# Patient Record
Sex: Female | Born: 1965 | Hispanic: Yes | Marital: Single | Smoking: Never smoker
Health system: Southern US, Community
[De-identification: ages and names within clinical notes are randomized; demographics above are authoritative.]

## PROBLEM LIST (undated history)

## (undated) ENCOUNTER — Emergency Department (HOSPITAL_COMMUNITY): Admission: EM | Discharge status: Absent without leave | Payer: Self-pay

## (undated) DIAGNOSIS — K819 Cholecystitis, unspecified: Secondary | ICD-10-CM

## (undated) DIAGNOSIS — K810 Acute cholecystitis: Secondary | ICD-10-CM

## (undated) DIAGNOSIS — K8020 Calculus of gallbladder without cholecystitis without obstruction: Secondary | ICD-10-CM

## (undated) HISTORY — DX: Cholecystitis, unspecified: K81.9

## (undated) HISTORY — PX: NO PAST SURGERIES: SHX2092

## (undated) HISTORY — DX: Calculus of gallbladder without cholecystitis without obstruction: K80.20

## (undated) HISTORY — DX: Acute cholecystitis: K81.0

---

## 2014-03-30 ENCOUNTER — Emergency Department: Payer: Self-pay | Admitting: Emergency Medicine

## 2016-06-15 ENCOUNTER — Encounter: Payer: Self-pay | Admitting: Emergency Medicine

## 2016-06-15 ENCOUNTER — Emergency Department: Payer: Self-pay

## 2016-06-15 ENCOUNTER — Emergency Department
Admission: EM | Admit: 2016-06-15 | Discharge: 2016-06-15 | Disposition: A | Discharge status: Home / Self Care | Payer: Self-pay | Attending: Emergency Medicine | Admitting: Emergency Medicine

## 2016-06-15 DIAGNOSIS — R1013 Epigastric pain: Secondary | ICD-10-CM

## 2016-06-15 DIAGNOSIS — K8020 Calculus of gallbladder without cholecystitis without obstruction: Secondary | ICD-10-CM | POA: Insufficient documentation

## 2016-06-15 LAB — BASIC METABOLIC PANEL
ANION GAP: 7 (ref 5–15)
BUN: 14 mg/dL (ref 6–20)
CHLORIDE: 108 mmol/L (ref 101–111)
CO2: 24 mmol/L (ref 22–32)
Calcium: 9.2 mg/dL (ref 8.9–10.3)
Creatinine, Ser: 0.71 mg/dL (ref 0.44–1.00)
GFR calc Af Amer: >60 mL/min (ref >60)
Glucose, Bld: 128 mg/dL — ABNORMAL HIGH (ref 65–99)
Potassium: 3.6 mmol/L (ref 3.5–5.1)
SODIUM: 139 mmol/L (ref 135–145)

## 2016-06-15 LAB — CBC
HEMATOCRIT: 44 % (ref 35.0–47.0)
Hemoglobin: 15.3 g/dL (ref 12.0–16.0)
MCH: 31 pg (ref 26.0–34.0)
MCHC: 34.7 g/dL (ref 32.0–36.0)
MCV: 89.3 fL (ref 80.0–100.0)
Platelets: 163 10*3/uL (ref 150–440)
RBC: 4.93 MIL/uL (ref 3.80–5.20)
RDW: 12.7 % (ref 11.5–14.5)
WBC: 7.6 10*3/uL (ref 3.6–11.0)

## 2016-06-15 LAB — HEPATIC FUNCTION PANEL
ALBUMIN: 4.3 g/dL (ref 3.5–5.0)
ALK PHOS: 102 U/L (ref 38–126)
ALT: 27 U/L (ref 14–54)
AST: 57 U/L — AB (ref 15–41)
BILIRUBIN TOTAL: 1.2 mg/dL (ref 0.3–1.2)
Bilirubin, Direct: 0.2 mg/dL (ref 0.1–0.5)
Indirect Bilirubin: 1 mg/dL — ABNORMAL HIGH (ref 0.3–0.9)
TOTAL PROTEIN: 7.7 g/dL (ref 6.5–8.1)

## 2016-06-15 LAB — TROPONIN I: Troponin I: <0.03 ng/mL (ref <0.03)

## 2016-06-15 LAB — LIPASE, BLOOD: Lipase: 21 U/L (ref 11–51)

## 2016-06-15 MED ORDER — SODIUM CHLORIDE 0.9 % IV BOLUS (SEPSIS)
1000.0000 mL | Freq: Once | INTRAVENOUS | Status: AC
  Administered 2016-06-15: 1000 mL via INTRAVENOUS

## 2016-06-15 MED ORDER — ONDANSETRON HCL 4 MG PO TABS: 4.0000 mg | ORAL_TABLET | Freq: Three times a day (TID) | ORAL | 0 refills | Status: DC | PRN

## 2016-06-15 MED ORDER — ONDANSETRON HCL 4 MG/2ML IJ SOLN
INTRAMUSCULAR | Status: AC
  Administered 2016-06-15: 4 mg via INTRAVENOUS
  Filled 2016-06-15: qty 2

## 2016-06-15 MED ORDER — MORPHINE SULFATE (PF) 4 MG/ML IV SOLN
INTRAVENOUS | Status: AC
  Administered 2016-06-15: 4 mg via INTRAVENOUS
  Filled 2016-06-15: qty 1

## 2016-06-15 MED ORDER — OXYCODONE-ACETAMINOPHEN 5-325 MG PO TABS
1.0000 | ORAL_TABLET | Freq: Once | ORAL | Status: AC
  Administered 2016-06-15: 1 via ORAL

## 2016-06-15 MED ORDER — OXYCODONE-ACETAMINOPHEN 5-325 MG PO TABS: 1.0000 | ORAL_TABLET | ORAL | 0 refills | Status: DC | PRN

## 2016-06-15 MED ORDER — OXYCODONE-ACETAMINOPHEN 5-325 MG PO TABS
ORAL_TABLET | ORAL | Status: AC
  Filled 2016-06-15: qty 1

## 2016-06-15 MED ORDER — MORPHINE SULFATE (PF) 4 MG/ML IV SOLN
4.0000 mg | Freq: Once | INTRAVENOUS | Status: AC
  Administered 2016-06-15: 4 mg via INTRAVENOUS

## 2016-06-15 MED ORDER — ONDANSETRON HCL 4 MG/2ML IJ SOLN
4.0000 mg | Freq: Once | INTRAMUSCULAR | Status: AC
  Administered 2016-06-15: 4 mg via INTRAVENOUS

## 2016-06-15 NOTE — Discharge Instructions (Signed)
Please seek medical attention for any high fevers, chest pain, shortness of breath, change in behavior, persistent vomiting, bloody stool or any other new or concerning symptoms.  

## 2016-06-15 NOTE — ED Provider Notes (Signed)
[facility] Emergency Department Provider Note   ____________________________________________   I have reviewed the triage vital signs and the nursing notes.   HISTORY  Chief Complaint Epigastric pain and vomiting  History limited by: Not Limited   HPI Christine Flowers is a 51 y.o. female who presents to the emergency department today because of concerns for epigastric pain. Patient states that it started yesterday. She had been feeling tired throughout the day and then as the night progressed the pain got worse. She describes it as sharp. It does radiate to her back and her shoulders. It has been constant today. It has been accompanied by nausea and vomiting. Patient has not had any fevers. She states she had similar pain roughly 1 year ago and was told she had a pulled muscle.   History reviewed. No pertinent past medical history.  There are no active problems to display for this patient.   History reviewed. No pertinent surgical history.  Prior to Admission medications   Not on File    Allergies Patient has no known allergies.  No family history on file.  Social History Social History  Substance Use Topics  . Smoking status: Never Smoker  . Smokeless tobacco: Never Used  . Alcohol use No    Review of Systems Constitutional: No fever/chills Eyes: No visual changes. ENT: No sore throat. Cardiovascular: Positive chest pain. Respiratory: Denies shortness of breath. Gastrointestinal: Positive for epigastric pain, vomiting Genitourinary: Negative for dysuria. Musculoskeletal: Negative for back pain. Skin: Negative for rash. Neurological: Negative for headaches, focal weakness or numbness.  ____________________________________________   PHYSICAL EXAM:  VITAL SIGNS: ED Triage Vitals  Enc Vitals Group     BP 06/15/16 1443 130/81     Pulse Rate 06/15/16 1443 77     Resp --      Temp 06/15/16 1443 97.9 F (36.6 C)     Temp Source  06/15/16 1443 Oral     SpO2 06/15/16 1443 97 %     Weight 06/15/16 1446 160 lb (72.6 kg)     Height 06/15/16 1446 5\' 5"  (1.651 m)     Head Circumference --      Peak Flow --      Pain Score 06/15/16 1449 6    Constitutional: Alert and oriented. Appears uncomfortable.  Eyes: Conjunctivae are normal.  ENT   Head: Normocephalic and atraumatic.   Nose: No congestion/rhinnorhea.   Mouth/Throat: Mucous membranes are moist.   Neck: No stridor. Hematological/Lymphatic/Immunilogical: No cervical lymphadenopathy. Cardiovascular: Normal rate, regular rhythm.  No murmurs, rubs, or gallops. Respiratory: Normal respiratory effort without tachypnea nor retractions. Breath sounds are clear and equal bilaterally. No wheezes/rales/rhonchi. Gastrointestinal: Soft and tender in the epigastric region. No rebound. No guarding.  Genitourinary: Deferred Musculoskeletal: Normal range of motion in all extremities. No lower extremity edema. Neurologic:  Normal speech and language. No gross focal neurologic deficits are appreciated.  Skin:  Skin is warm, dry and intact. No rash noted. Psychiatric: Mood and affect are normal. Speech and behavior are normal. Patient exhibits appropriate insight and judgment.  ____________________________________________    LABS (pertinent positives/negatives)  Labs Reviewed  BASIC METABOLIC PANEL - Abnormal; Notable for the following:       Result Value   Glucose, Bld 128 (*)    All other components within normal limits  HEPATIC FUNCTION PANEL - Abnormal; Notable for the following:    AST 57 (*)    Indirect Bilirubin 1.0 (*)    All other  components within normal limits  CBC  TROPONIN I  LIPASE, BLOOD     ____________________________________________   EKG  I, Phineas Semen, attending physician, personally viewed and interpreted this EKG  EKG Time: 1440 Rate: 68 Rhythm: normal sinus rhythm Axis: normal Intervals: qtc 421 QRS: LVH ST changes:  no st elevation Impression: abnormal ekg   ____________________________________________    RADIOLOGY  CXR  IMPRESSION: No active cardiopulmonary disease.  [place] RUQ IMPRESSION: 1. Gallbladder contracted around numerous stones. Absent sonographic Murphy sign arguing against acute cholecystitis. 2. No evidence of biliary obstruction and normal ultrasound appearance of the liver. ____________________________________________   PROCEDURES  Procedures  ____________________________________________   INITIAL IMPRESSION / ASSESSMENT AND PLAN / ED COURSE  Pertinent labs & imaging results that were available during my care of the patient were reviewed by me and considered in my medical decision making (see chart for details).  Patient presented to the emergency department today because of concerns for epigastric pain. Workup does show cholelithiasis. Patient did feel better after pain medication. No signs of acute cholecystitis blood work did not show any concerning leukocytosis transaminitis or elevated lipase. At this point I do feel patient is safe for outpatient follow-up. Will give surgery information. Will give pain medication and nausea medication. [place] Drug Database Was Queried Prior to Prescription.  ____________________________________________   FINAL CLINICAL IMPRESSION(S) / ED DIAGNOSES  Final diagnoses:  Epigastric pain  Gallstones     Note: This dictation was prepared with Dragon dictation. Any transcriptional errors that result from this process are unintentional     Phineas Semen, MD 06/15/16 1910

## 2016-06-15 NOTE — ED Triage Notes (Signed)
Patient presents to the ED with severe intermittent centralized chest pain that radiates into patient's back and shoulders.  Patient states shoulder/back pain began yesterday but pain became severe today.  Patient states around 2pm she began vomiting and has vomited approx. 5 times since then.  Patient denies abdominal pain and diarrhea.  Patient reports pain feels like a stabbing pain.  Patient appears uncomfortable, holding her chest and tearful during triage.

## 2016-06-15 NOTE — ED Notes (Signed)
Pt daughter reports that pt is having chest and back pain since yesterday - she states the pain increased today and she has started to vomit - vomited 4 times in 24 hours - denies pain or difficulty with urination

## 2016-06-23 ENCOUNTER — Telehealth: Payer: Self-pay

## 2016-06-23 NOTE — Telephone Encounter (Signed)
Patient was seen in the ED on 06/15/16 for Cholelithiasis and Epigastric Pain. I called her to make a follow up appointment in our office. I received her voicemail and left her a message to call us back to schedule.

## 2016-07-05 ENCOUNTER — Encounter: Payer: Self-pay | Admitting: Surgery

## 2016-07-05 ENCOUNTER — Ambulatory Visit (INDEPENDENT_AMBULATORY_CARE_PROVIDER_SITE_OTHER): Payer: Self-pay | Admitting: Surgery

## 2016-07-05 ENCOUNTER — Inpatient Hospital Stay
Admission: AD | Admit: 2016-07-05 | Discharge: 2016-07-07 | DRG: 419 | Disposition: A | Discharge status: Home / Self Care | Payer: Medicaid Other | Source: Ambulatory Visit | Attending: General Surgery | Admitting: General Surgery

## 2016-07-05 DIAGNOSIS — K810 Acute cholecystitis: Secondary | ICD-10-CM | POA: Diagnosis present

## 2016-07-05 DIAGNOSIS — K819 Cholecystitis, unspecified: Secondary | ICD-10-CM | POA: Diagnosis present

## 2016-07-05 DIAGNOSIS — K8012 Calculus of gallbladder with acute and chronic cholecystitis without obstruction: Secondary | ICD-10-CM | POA: Diagnosis present

## 2016-07-05 HISTORY — DX: Cholecystitis, unspecified: K81.9

## 2016-07-05 HISTORY — DX: Acute cholecystitis: K81.0

## 2016-07-05 LAB — COMPREHENSIVE METABOLIC PANEL
ALK PHOS: 124 U/L (ref 38–126)
ALT: 67 U/L — ABNORMAL HIGH (ref 14–54)
AST: 38 U/L (ref 15–41)
Albumin: 4.1 g/dL (ref 3.5–5.0)
Anion gap: 8 (ref 5–15)
BILIRUBIN TOTAL: 0.9 mg/dL (ref 0.3–1.2)
BUN: 10 mg/dL (ref 6–20)
CALCIUM: 9.1 mg/dL (ref 8.9–10.3)
CO2: 23 mmol/L (ref 22–32)
Chloride: 105 mmol/L (ref 101–111)
Creatinine, Ser: 0.63 mg/dL (ref 0.44–1.00)
GFR calc Af Amer: >60 mL/min (ref >60)
Glucose, Bld: 180 mg/dL — ABNORMAL HIGH (ref 65–99)
POTASSIUM: 3.6 mmol/L (ref 3.5–5.1)
Sodium: 136 mmol/L (ref 135–145)
TOTAL PROTEIN: 7.2 g/dL (ref 6.5–8.1)

## 2016-07-05 LAB — CBC WITH DIFFERENTIAL/PLATELET
BASOS PCT: 0 %
Basophils Absolute: 0 10*3/uL (ref 0–0.1)
EOS ABS: 0.3 10*3/uL (ref 0–0.7)
EOS PCT: 4 %
HCT: 40.9 % (ref 35.0–47.0)
HEMOGLOBIN: 14 g/dL (ref 12.0–16.0)
Lymphocytes Relative: 34 %
Lymphs Abs: 2.4 10*3/uL (ref 1.0–3.6)
MCH: 30.9 pg (ref 26.0–34.0)
MCHC: 34.3 g/dL (ref 32.0–36.0)
MCV: 90.3 fL (ref 80.0–100.0)
MONO ABS: 0.3 10*3/uL (ref 0.2–0.9)
MONOS PCT: 4 %
NEUTROS PCT: 58 %
Neutro Abs: 4.1 10*3/uL (ref 1.4–6.5)
PLATELETS: 160 10*3/uL (ref 150–440)
RBC: 4.53 MIL/uL (ref 3.80–5.20)
RDW: 12.6 % (ref 11.5–14.5)
WBC: 7.2 10*3/uL (ref 3.6–11.0)

## 2016-07-05 LAB — SURGICAL PCR SCREEN
MRSA, PCR: NEGATIVE
STAPHYLOCOCCUS AUREUS: NEGATIVE

## 2016-07-05 LAB — PHOSPHORUS: Phosphorus: 3.3 mg/dL (ref 2.5–4.6)

## 2016-07-05 LAB — MAGNESIUM: MAGNESIUM: 1.7 mg/dL (ref 1.7–2.4)

## 2016-07-05 MED ORDER — HYDROMORPHONE HCL 2 MG/ML IJ SOLN: 0.5000 mg | INTRAMUSCULAR | Status: AC | PRN

## 2016-07-05 MED ORDER — PANTOPRAZOLE SODIUM 40 MG IV SOLR
40.0000 mg | Freq: Every day | INTRAVENOUS | Status: DC
  Administered 2016-07-05 – 2016-07-06 (×2): 40 mg via INTRAVENOUS
  Filled 2016-07-05 (×2): qty 40

## 2016-07-05 MED ORDER — PANTOPRAZOLE SODIUM 40 MG IV SOLR: 40.0000 mg | Freq: Every day | INTRAVENOUS | Status: AC

## 2016-07-05 MED ORDER — KCL IN DEXTROSE-NACL 20-5-0.45 MEQ/L-%-% IV SOLN
INTRAVENOUS | Status: DC
  Administered 2016-07-05 – 2016-07-06 (×2): via INTRAVENOUS
  Filled 2016-07-05 (×6): qty 1000

## 2016-07-05 MED ORDER — PIPERACILLIN-TAZOBACTAM 3.375 G IVPB
3.3750 g | Freq: Three times a day (TID) | INTRAVENOUS | Status: DC
  Administered 2016-07-05 – 2016-07-07 (×5): 3.375 g via INTRAVENOUS
  Filled 2016-07-05 (×8): qty 50

## 2016-07-05 MED ORDER — ONDANSETRON HCL 40 MG/20ML IJ SOLN: 4.0000 mg | Freq: Four times a day (QID) | INTRAMUSCULAR | Status: AC | PRN

## 2016-07-05 MED ORDER — HYDRALAZINE HCL 20 MG/ML IJ SOLN: 10.0000 mg | INTRAMUSCULAR | Status: DC | PRN

## 2016-07-05 MED ORDER — POLYETHYLENE GLYCOL 3350 17 G PO PACK: 17.0000 g | PACK | Freq: Every day | ORAL | Status: AC | PRN

## 2016-07-05 MED ORDER — DIPHENHYDRAMINE HCL 25 MG PO CAPS: 25.0000 mg | ORAL_CAPSULE | Freq: Four times a day (QID) | ORAL | Status: DC | PRN

## 2016-07-05 MED ORDER — KETOROLAC TROMETHAMINE 30 MG/ML IJ SOLN: 30.0000 mg | Freq: Four times a day (QID) | INTRAMUSCULAR | Status: AC

## 2016-07-05 MED ORDER — ONDANSETRON 4 MG PO TBDP: 4.0000 mg | ORAL_TABLET | Freq: Four times a day (QID) | ORAL | Status: DC | PRN

## 2016-07-05 MED ORDER — ENOXAPARIN SODIUM 150 MG/ML ~~LOC~~ SOLN: 40.0000 mg | SUBCUTANEOUS | Status: AC

## 2016-07-05 MED ORDER — LACTATED RINGERS IV SOLN: 125.0000 mL/h | INTRAVENOUS | Status: AC

## 2016-07-05 MED ORDER — MORPHINE SULFATE (PF) 4 MG/ML IV SOLN
4.0000 mg | INTRAVENOUS | Status: DC | PRN
  Administered 2016-07-05 – 2016-07-06 (×5): 4 mg via INTRAVENOUS
  Filled 2016-07-05 (×5): qty 1

## 2016-07-05 MED ORDER — DIPHENHYDRAMINE HCL 50 MG/ML IJ SOLN: 25.0000 mg | Freq: Four times a day (QID) | INTRAMUSCULAR | Status: DC | PRN

## 2016-07-05 MED ORDER — ONDANSETRON HCL 4 MG/2ML IJ SOLN
4.0000 mg | Freq: Four times a day (QID) | INTRAMUSCULAR | Status: DC | PRN
  Administered 2016-07-05 – 2016-07-06 (×4): 4 mg via INTRAVENOUS
  Filled 2016-07-05 (×4): qty 2

## 2016-07-05 MED ORDER — ENOXAPARIN SODIUM 40 MG/0.4ML ~~LOC~~ SOLN
40.0000 mg | SUBCUTANEOUS | Status: DC
  Administered 2016-07-05 – 2016-07-06 (×2): 40 mg via SUBCUTANEOUS
  Filled 2016-07-05 (×2): qty 0.4

## 2016-07-05 NOTE — H&P (Signed)
07/05/2016  Reason for Admission:  Acute cholecystitis  History of Present Illness: Christine Flowers is a 51 y.o. female who presented to the ED on 5/24 with abdominal pain and was diagnosed with cholelithiasis.  She had a normal WBC and LFTs and was discharged to home with pain and nausea medication.  She reports that the pain was better with her pain medication, but she has otherwise had daily episodes of pain and nausea, multiples times per day after eating.  Now that her pain medication has run out, she has worse pain.  She denies any emesis, but attributes this to the nausea medication that she's taking.  She reports that pain is epigastric and right upper quadrant, with radiation to the right shoulder.  Denies any fevers at home but does report chills.  Denies chest pain or shortness of breath.  Was constipated while taking the pain medication but now has normal bowel movements.  Denies dysuria or hematuria.  Denies any yellow color to her skin or sclera.  Of note, about a week ago, the patient had started taking ibuprofen for lower extremity pain and took a few days of an antispasm medication which helped and now she no longer has any lower extremity issues.  Past Medical History:     Past Medical History:  Diagnosis Date  . Gallstones      Past Surgical History:      Past Surgical History:  Procedure Laterality Date  . NO PAST SURGERIES      Home Medications:        Prior to Admission medications   Medication Sig Start Date End Date Taking? Authorizing Provider  ondansetron (ZOFRAN) 4 MG tablet Take 1 tablet (4 mg total) by mouth every 8 (eight) hours as needed for nausea or vomiting. 06/15/16   Phineas Semen, MD  oxyCODONE-acetaminophen (ROXICET) 5-325 MG tablet Take 1 tablet by mouth every 4 (four) hours as needed for severe pain. 06/15/16   Phineas Semen, MD    Allergies: No Known Allergies  Social History:  reports that she has never smoked. She  has never used smokeless tobacco. She reports that she does not drink alcohol or use drugs.   Family History:      Family History  Problem Relation Age of Onset  . Cervical cancer Mother   . Gallstones Mother   . Diabetes Mother   . Hypotension Mother   . Hypertension Father   . Diabetes Father   . Emphysema Father   . Diabetes Paternal Aunt   . Breast cancer Other   . Cervical cancer Maternal Aunt     Review of Systems: Review of Systems  Constitutional: Positive for chills. Negative for fever.  HENT: Negative for hearing loss.   Eyes: Negative for blurred vision.  Respiratory: Negative for cough and shortness of breath.   Cardiovascular: Negative for chest pain.  Gastrointestinal: Positive for abdominal pain and nausea. Negative for blood in stool, constipation, diarrhea, heartburn and vomiting.  Genitourinary: Negative for dysuria.  Musculoskeletal:       Right shoulder pain  Skin: Negative for rash.  Neurological: Negative for dizziness.  Psychiatric/Behavioral: Negative for depression.  All other systems reviewed and are negative.   Physical Exam BP 114/78   Pulse 75   Temp 98.1 F (36.7 C) (Oral)   Ht 5\' 4"  (1.626 m)   Wt 75.3 kg (166 lb)   BMI 28.49 kg/m  CONSTITUTIONAL: No acute distress, well nourished. HEENT:  Normocephalic, atraumatic, extraocular motion  intact. NECK: Trachea is midline, and there is no jugular venous distension.  RESPIRATORY:  Lungs are clear, and breath sounds are equal bilaterally. Normal respiratory effort without pathologic use of accessory muscles. CARDIOVASCULAR: Heart is regular without murmurs, gallops, or rubs. GI: The abdomen is soft, nondistended, with tenderness to palpation in the epigastric and right upper quadrant regions, with a positive Murphy's sign.  The does have some RUQ discomfort when palpating on the RLQ. There were no palpable masses.  MUSCULOSKELETAL:  Normal muscle strength and tone in all four  extremities.  No peripheral edema or cyanosis. SKIN: Skin turgor is normal. There are no pathologic skin lesions.  NEUROLOGIC:  Motor and sensation is grossly normal.  Cranial nerves are grossly intact. PSYCH:  Alert and oriented to person, place and time. Affect is normal.  Laboratory Analysis: From 06/15/16 -- WBC of 7.6, hct 15.3, T bili 1.2, AST 57, ALT 27, AP 102.  Cr 0.71.  Imaging: U/S from 06/16/26 -- 1. Gallbladder contracted around numerous stones. Absent sonographic Murphy sign arguing against acute cholecystitis.  2. No evidence of biliary obstruction and normal ultrasound appearance of the liver.  Assessment and Plan: This is a 51 y.o. female who presents with abdominal pain and what appears to be acute cholecystitis.  I have independently viewed the patient's imaging studies and reviewed the patient's laboratory studies from her ED visit.  The patient at the time had cholelithiasis without evidence of cholecystitis on ultrasound, but today does have evidence of cholecystitis on exam.    After examination and discussion with the patient in the office, I am recommending that the patient be admitted and undergo cholecystectomy during this hospital admission.  She has a positive Murphy's sign with daily episodes of pain and nausea with meals, and do not think trying to schedule her for elective surgery is indicated.  She will be admitted to the hospital and be NPO after midnight with IV fluid hydration.  She will be started on IV antibiotics and appropriate pain/nausea control.  No new imaging study is needed at this time, but will order new CBC and CMP.  She will be consented for laparoscopic cholecystectomy.  The risks of the procedure, including risk of bleeding, infection, injury to surrounding structures, and need for further procedures have been explained to the patient. I have discussed with Dr. Tonita CongWoodham, who is the surgeon on call today for admission and surgery tomorrow as an  add-on.   Howie IllJose Luis Aerik Polan, MD [facility]

## 2016-07-05 NOTE — Progress Notes (Signed)
07/05/2016  Reason for Admission:  Acute cholecystitis  History of Present Illness: Christine Flowers is a 51 y.o. female who presented to the ED on 5/24 with abdominal pain and was diagnosed with cholelithiasis.  She had a normal WBC and LFTs and was discharged to home with pain and nausea medication.  She reports that the pain was better with her pain medication, but she has otherwise had daily episodes of pain and nausea, multiples times per day after eating.  Now that her pain medication has run out, she has worse pain.  She denies any emesis, but attributes this to the nausea medication that she's taking.  She reports that pain is epigastric and right upper quadrant, with radiation to the right shoulder.  Denies any fevers at home but does report chills.  Denies chest pain or shortness of breath.  Was constipated while taking the pain medication but now has normal bowel movements.  Denies dysuria or hematuria.  Denies any yellow color to her skin or sclera.  Of note, about a week ago, the patient had started taking ibuprofen for lower extremity pain and took a few days of an antispasm medication which helped and now she no longer has any lower extremity issues.  Past Medical History: Past Medical History:  Diagnosis Date  . Gallstones      Past Surgical History: Past Surgical History:  Procedure Laterality Date  . NO PAST SURGERIES      Home Medications: Prior to Admission medications   Medication Sig Start Date End Date Taking? Authorizing Provider  ondansetron (ZOFRAN) 4 MG tablet Take 1 tablet (4 mg total) by mouth every 8 (eight) hours as needed for nausea or vomiting. 06/15/16   Phineas SemenGoodman, [place], MD  oxyCODONE-acetaminophen (ROXICET) 5-325 MG tablet Take 1 tablet by mouth every 4 (four) hours as needed for severe pain. 06/15/16   Phineas SemenGoodman, Graydon, MD    Allergies: No Known Allergies  Social History:  reports that she has never smoked. She has never used smokeless tobacco.  She reports that she does not drink alcohol or use drugs.   Family History: Family History  Problem Relation Age of Onset  . Cervical cancer Mother   . Gallstones Mother   . Diabetes Mother   . Hypotension Mother   . Hypertension Father   . Diabetes Father   . Emphysema Father   . Diabetes Paternal Aunt   . Breast cancer Other   . Cervical cancer Maternal Aunt     Review of Systems: Review of Systems  Constitutional: Positive for chills. Negative for fever.  HENT: Negative for hearing loss.   Eyes: Negative for blurred vision.  Respiratory: Negative for cough and shortness of breath.   Cardiovascular: Negative for chest pain.  Gastrointestinal: Positive for abdominal pain and nausea. Negative for blood in stool, constipation, diarrhea, heartburn and vomiting.  Genitourinary: Negative for dysuria.  Musculoskeletal:       Right shoulder pain  Skin: Negative for rash.  Neurological: Negative for dizziness.  Psychiatric/Behavioral: Negative for depression.  All other systems reviewed and are negative.   Physical Exam BP 114/78   Pulse 75   Temp 98.1 F (36.7 C) (Oral)   Ht 5\' 4"  (1.626 m)   Wt 75.3 kg (166 lb)   BMI 28.49 kg/m  CONSTITUTIONAL: No acute distress, well nourished. HEENT:  Normocephalic, atraumatic, extraocular motion intact. NECK: Trachea is midline, and there is no jugular venous distension.  RESPIRATORY:  Lungs are clear, and breath sounds  are equal bilaterally. Normal respiratory effort without pathologic use of accessory muscles. CARDIOVASCULAR: Heart is regular without murmurs, gallops, or rubs. GI: The abdomen is soft, nondistended, with tenderness to palpation in the epigastric and right upper quadrant regions, with a positive Murphy's sign.  The does have some RUQ discomfort when palpating on the RLQ. There were no palpable masses.  MUSCULOSKELETAL:  Normal muscle strength and tone in all four extremities.  No peripheral edema or cyanosis. SKIN:  Skin turgor is normal. There are no pathologic skin lesions.  NEUROLOGIC:  Motor and sensation is grossly normal.  Cranial nerves are grossly intact. PSYCH:  Alert and oriented to person, place and time. Affect is normal.  Laboratory Analysis: From 06/15/16 -- WBC of 7.6, hct 15.3, T bili 1.2, AST 57, ALT 27, AP 102.  Cr 0.71.  Imaging: U/S from 06/16/26 -- 1. Gallbladder contracted around numerous stones. Absent sonographic Murphy sign arguing against acute cholecystitis.  2. No evidence of biliary obstruction and normal ultrasound appearance of the liver.  Assessment and Plan: This is a 51 y.o. female who presents with abdominal pain and what appears to be acute cholecystitis.  I have independently viewed the patient's imaging studies and reviewed the patient's laboratory studies from her ED visit.  The patient at the time had cholelithiasis without evidence of cholecystitis on ultrasound, but today does have evidence of cholecystitis on exam.    After examination and discussion with the patient, I am recommending that the patient be admitted and undergo cholecystectomy during this hospital admission.  She has a positive Murphy's sign with daily episodes of pain and nausea with meals, and do not think trying to schedule her for elective surgery is indicated.  She will be admitted to the hospital and be NPO after midnight with IV fluid hydration.  She will be started on IV antibiotics and appropriate pain/nausea control.  No new imaging study is needed at this time, but will order new CBC and CMP.  She will be consented for laparoscopic cholecystectomy.  The risks of the procedure, including risk of bleeding, infection, injury to surrounding structures, and need for further procedures have been explained to the patient. I have discussed with Dr. Tonita Cong, who is the surgeon on call today for admission and surgery tomorrow as an add-on.   Howie Ill, MD [facility]

## 2016-07-05 NOTE — Progress Notes (Signed)
   07/05/16 1808  Clinical Encounter Type  Visited With Patient;Family;Health care provider  Visit Type Initial;Other (Comment) (Advance Directive)  Referral From Nurse   Chaplain responded to request to assist patient with obtaining an Advance Directive. Chaplain left the information with the patient to review. Patient will notify nurse when ready to complete the forms.

## 2016-07-05 NOTE — Patient Instructions (Signed)
[facility] [address], [place], [place] [address] [phone]

## 2016-07-05 NOTE — Progress Notes (Signed)
   Patient seen and examined.  Continues several quadrant pain.  Labs still pending.  Agree with Dr. Aleen CampiPiscoya. Discussed with plans for laparoscopic cholecystectomy tomorrow. All questions answered to the patient's satisfaction.  Ricarda Frameharles Cohan Stipes, MD [facility]  Day [facility] [phone](7a-7p) [phone] Night [facility] [phone](7p-7a) [phone]

## 2016-07-06 ENCOUNTER — Inpatient Hospital Stay: Payer: Medicaid Other | Admitting: Anesthesiology

## 2016-07-06 ENCOUNTER — Encounter
Admission: AD | Disposition: A | Discharge status: Home / Self Care | Payer: Self-pay | Source: Ambulatory Visit | Attending: General Surgery

## 2016-07-06 ENCOUNTER — Ambulatory Visit: Admit: 2016-07-06 | Payer: Self-pay | Admitting: General Surgery

## 2016-07-06 ENCOUNTER — Encounter: Payer: Self-pay | Admitting: *Deleted

## 2016-07-06 DIAGNOSIS — K819 Cholecystitis, unspecified: Secondary | ICD-10-CM

## 2016-07-06 DIAGNOSIS — K810 Acute cholecystitis: Secondary | ICD-10-CM

## 2016-07-06 HISTORY — PX: CHOLECYSTECTOMY: SHX55

## 2016-07-06 LAB — CBC
HCT: 38.8 % (ref 35.0–47.0)
Hemoglobin: 13.2 g/dL (ref 12.0–16.0)
MCH: 30.7 pg (ref 26.0–34.0)
MCHC: 34 g/dL (ref 32.0–36.0)
MCV: 90.1 fL (ref 80.0–100.0)
Platelets: 148 10*3/uL — ABNORMAL LOW (ref 150–440)
RBC: 4.3 MIL/uL (ref 3.80–5.20)
RDW: 12.5 % (ref 11.5–14.5)
WBC: 7.6 10*3/uL (ref 3.6–11.0)

## 2016-07-06 LAB — COMPREHENSIVE METABOLIC PANEL
ALK PHOS: 108 U/L (ref 38–126)
ALT: 54 U/L (ref 14–54)
AST: 26 U/L (ref 15–41)
Albumin: 3.6 g/dL (ref 3.5–5.0)
Anion gap: 4 — ABNORMAL LOW (ref 5–15)
BUN: 9 mg/dL (ref 6–20)
CALCIUM: 8.5 mg/dL — AB (ref 8.9–10.3)
CHLORIDE: 107 mmol/L (ref 101–111)
CO2: 26 mmol/L (ref 22–32)
Creatinine, Ser: 0.57 mg/dL (ref 0.44–1.00)
GFR calc non Af Amer: >60 mL/min (ref >60)
Glucose, Bld: 106 mg/dL — ABNORMAL HIGH (ref 65–99)
POTASSIUM: 3.9 mmol/L (ref 3.5–5.1)
SODIUM: 137 mmol/L (ref 135–145)
Total Bilirubin: 1 mg/dL (ref 0.3–1.2)
Total Protein: 6.4 g/dL — ABNORMAL LOW (ref 6.5–8.1)

## 2016-07-06 LAB — PREGNANCY, URINE: PREG TEST UR: NEGATIVE

## 2016-07-06 SURGERY — LAPAROSCOPIC CHOLECYSTECTOMY
Anesthesia: General | Wound class: Clean Contaminated

## 2016-07-06 MED ORDER — LIDOCAINE HCL (CARDIAC) 20 MG/ML IV SOLN
INTRAVENOUS | Status: DC | PRN
  Administered 2016-07-06: 100 mg via INTRAVENOUS

## 2016-07-06 MED ORDER — HYDROCODONE-ACETAMINOPHEN 5-325 MG PO TABS
1.0000 | ORAL_TABLET | ORAL | Status: DC | PRN
  Administered 2016-07-06: 2 via ORAL
  Administered 2016-07-06 (×2): 1 via ORAL
  Administered 2016-07-07 (×2): 2 via ORAL
  Filled 2016-07-06: qty 1
  Filled 2016-07-06 (×3): qty 2
  Filled 2016-07-06: qty 1

## 2016-07-06 MED ORDER — LIDOCAINE HCL (PF) 2 % IJ SOLN
INTRAMUSCULAR | Status: AC
  Filled 2016-07-06: qty 2

## 2016-07-06 MED ORDER — ROCURONIUM BROMIDE 50 MG/5ML IV SOLN
INTRAVENOUS | Status: AC
  Filled 2016-07-06: qty 1

## 2016-07-06 MED ORDER — SEVOFLURANE IN SOLN
RESPIRATORY_TRACT | Status: AC
  Filled 2016-07-06: qty 250

## 2016-07-06 MED ORDER — PROPOFOL 10 MG/ML IV BOLUS
INTRAVENOUS | Status: AC
Start: 2016-07-06 — End: ?
  Filled 2016-07-06: qty 20

## 2016-07-06 MED ORDER — FENTANYL CITRATE (PF) 100 MCG/2ML IJ SOLN
25.0000 ug | INTRAMUSCULAR | Status: DC | PRN
  Administered 2016-07-06 (×4): 25 ug via INTRAVENOUS

## 2016-07-06 MED ORDER — ONDANSETRON HCL 4 MG/2ML IJ SOLN
INTRAMUSCULAR | Status: AC
  Filled 2016-07-06: qty 2

## 2016-07-06 MED ORDER — SUGAMMADEX SODIUM 200 MG/2ML IV SOLN
INTRAVENOUS | Status: AC
  Filled 2016-07-06: qty 2

## 2016-07-06 MED ORDER — ACETAMINOPHEN 10 MG/ML IV SOLN
INTRAVENOUS | Status: AC
Start: 2016-07-06 — End: ?
  Filled 2016-07-06: qty 100

## 2016-07-06 MED ORDER — DEXAMETHASONE SODIUM PHOSPHATE 10 MG/ML IJ SOLN
INTRAMUSCULAR | Status: DC | PRN
  Administered 2016-07-06: 5 mg via INTRAVENOUS

## 2016-07-06 MED ORDER — FENTANYL CITRATE (PF) 100 MCG/2ML IJ SOLN
INTRAMUSCULAR | Status: DC | PRN
  Administered 2016-07-06 (×2): 50 ug via INTRAVENOUS
  Administered 2016-07-06: 100 ug via INTRAVENOUS

## 2016-07-06 MED ORDER — LACTATED RINGERS IV SOLN
INTRAVENOUS | Status: DC
  Administered 2016-07-06 – 2016-07-07 (×3): via INTRAVENOUS

## 2016-07-06 MED ORDER — FENTANYL CITRATE (PF) 100 MCG/2ML IJ SOLN
INTRAMUSCULAR | Status: AC
  Filled 2016-07-06: qty 2

## 2016-07-06 MED ORDER — ACETAMINOPHEN 10 MG/ML IV SOLN
INTRAVENOUS | Status: DC | PRN
  Administered 2016-07-06: 1000 mg via INTRAVENOUS

## 2016-07-06 MED ORDER — SUGAMMADEX SODIUM 200 MG/2ML IV SOLN
INTRAVENOUS | Status: DC | PRN
  Administered 2016-07-06: 150 mg via INTRAVENOUS

## 2016-07-06 MED ORDER — ROCURONIUM BROMIDE 100 MG/10ML IV SOLN
INTRAVENOUS | Status: DC | PRN
  Administered 2016-07-06: 50 mg via INTRAVENOUS

## 2016-07-06 MED ORDER — PROPOFOL 10 MG/ML IV BOLUS
INTRAVENOUS | Status: DC | PRN
  Administered 2016-07-06: 150 mg via INTRAVENOUS

## 2016-07-06 MED ORDER — BUPIVACAINE HCL 0.5 % IJ SOLN
INTRAMUSCULAR | Status: DC | PRN
  Administered 2016-07-06: 25 mL

## 2016-07-06 MED ORDER — OXYCODONE HCL 5 MG PO TABS: 5.0000 mg | ORAL_TABLET | Freq: Once | ORAL | Status: DC | PRN

## 2016-07-06 MED ORDER — MIDAZOLAM HCL 2 MG/2ML IJ SOLN
INTRAMUSCULAR | Status: AC
  Filled 2016-07-06: qty 2

## 2016-07-06 MED ORDER — OXYCODONE HCL 5 MG/5ML PO SOLN: 5.0000 mg | Freq: Once | ORAL | Status: DC | PRN

## 2016-07-06 MED ORDER — ONDANSETRON HCL 4 MG/2ML IJ SOLN
INTRAMUSCULAR | Status: DC | PRN
  Administered 2016-07-06: 4 mg via INTRAVENOUS

## 2016-07-06 MED ORDER — MIDAZOLAM HCL 2 MG/2ML IJ SOLN
INTRAMUSCULAR | Status: DC | PRN
  Administered 2016-07-06: 2 mg via INTRAVENOUS

## 2016-07-06 MED ORDER — MEPERIDINE HCL 50 MG/ML IJ SOLN: 6.2500 mg | INTRAMUSCULAR | Status: DC | PRN

## 2016-07-06 MED ORDER — PROMETHAZINE HCL 25 MG/ML IJ SOLN: 6.2500 mg | INTRAMUSCULAR | Status: DC | PRN

## 2016-07-06 SURGICAL SUPPLY — 45 items
ADHESIVE MASTISOL STRL (MISCELLANEOUS) ×3 IMPLANT
APPLIER CLIP ROT 10 11.4 M/L (STAPLE) ×3
BAG COUNTER SPONGE EZ (MISCELLANEOUS) IMPLANT
BLADE SURG SZ11 CARB STEEL (BLADE) ×3 IMPLANT
CANISTER SUCT 1200ML W/VALVE (MISCELLANEOUS) ×3 IMPLANT
CATH CHOLANG 76X19 KUMAR (CATHETERS) IMPLANT
CHLORAPREP W/TINT 26ML (MISCELLANEOUS) ×3 IMPLANT
CLIP APPLIE ROT 10 11.4 M/L (STAPLE) ×1 IMPLANT
CLOSURE WOUND 1/2 X4 (GAUZE/BANDAGES/DRESSINGS) ×1
CONRAY 60ML FOR OR (MISCELLANEOUS) IMPLANT
COUNTER SPONGE BAG EZ (MISCELLANEOUS)
DECANTER SPIKE VIAL GLASS SM (MISCELLANEOUS) ×6 IMPLANT
DRAPE SHEET LG 3/4 BI-LAMINATE (DRAPES) IMPLANT
DRSG TEGADERM 2-3/8X2-3/4 SM (GAUZE/BANDAGES/DRESSINGS) ×12 IMPLANT
DRSG TELFA 4X3 1S NADH ST (GAUZE/BANDAGES/DRESSINGS) ×3 IMPLANT
ELECT REM PT RETURN 9FT ADLT (ELECTROSURGICAL) ×3
ELECTRODE REM PT RTRN 9FT ADLT (ELECTROSURGICAL) ×1 IMPLANT
GLOVE BIO SURGEON STRL SZ7.5 (GLOVE) ×9 IMPLANT
GLOVE INDICATOR 8.0 STRL GRN (GLOVE) ×9 IMPLANT
GOWN STRL REUS W/ TWL LRG LVL3 (GOWN DISPOSABLE) ×3 IMPLANT
GOWN STRL REUS W/TWL LRG LVL3 (GOWN DISPOSABLE) ×6
GRASPER SUT TROCAR 14GX15 (MISCELLANEOUS) IMPLANT
IRRIGATION STRYKERFLOW (MISCELLANEOUS) ×1 IMPLANT
IRRIGATOR STRYKERFLOW (MISCELLANEOUS) ×3
IV NS 1000ML (IV SOLUTION) ×2
IV NS 1000ML BAXH (IV SOLUTION) ×1 IMPLANT
L-HOOK LAP DISP 36CM (ELECTROSURGICAL) ×3
LABEL OR SOLS (LABEL) ×3 IMPLANT
LHOOK LAP DISP 36CM (ELECTROSURGICAL) ×1 IMPLANT
NEEDLE HYPO 25X1 1.5 SAFETY (NEEDLE) ×3 IMPLANT
NEEDLE VERESS 14GA 120MM (NEEDLE) ×3 IMPLANT
NS IRRIG 500ML POUR BTL (IV SOLUTION) ×3 IMPLANT
PACK LAP CHOLECYSTECTOMY (MISCELLANEOUS) ×3 IMPLANT
PENCIL ELECTRO HAND CTR (MISCELLANEOUS) ×3 IMPLANT
POUCH ENDO CATCH 10MM SPEC (MISCELLANEOUS) ×3 IMPLANT
SCISSORS METZENBAUM CVD 33 (INSTRUMENTS) ×3 IMPLANT
SLEEVE ENDOPATH XCEL 5M (ENDOMECHANICALS) ×6 IMPLANT
STRIP CLOSURE SKIN 1/2X4 (GAUZE/BANDAGES/DRESSINGS) ×2 IMPLANT
SUT MNCRL 4-0 (SUTURE) ×2
SUT MNCRL 4-0 27XMFL (SUTURE) ×1
SUT VICRYL 0 AB UR-6 (SUTURE) ×3 IMPLANT
SUTURE MNCRL 4-0 27XMF (SUTURE) ×1 IMPLANT
TROCAR XCEL 12X100 BLDLESS (ENDOMECHANICALS) ×3 IMPLANT
TROCAR XCEL NON-BLD 5MMX100MML (ENDOMECHANICALS) ×3 IMPLANT
TUBING INSUFFLATOR HI FLOW (MISCELLANEOUS) ×3 IMPLANT

## 2016-07-06 NOTE — Anesthesia Preprocedure Evaluation (Signed)
Anesthesia Evaluation  Patient identified by MRN, date of birth, ID band Patient awake    Reviewed: Allergy & Precautions, NPO status , Patient's Chart, lab work & pertinent test results  History of Anesthesia Complications Negative for: history of anesthetic complications  Airway Mallampati: II  TM Distance: >3 FB Neck ROM: Full    Dental no notable dental hx.    Pulmonary neg pulmonary ROS, neg sleep apnea, neg COPD,    breath sounds clear to auscultation- rhonchi (-) wheezing      Cardiovascular Exercise Tolerance: Good (-) hypertension(-) CAD and (-) Past MI  Rhythm:Regular Rate:Normal - Systolic murmurs and - Diastolic murmurs    Neuro/Psych negative neurological ROS  negative psych ROS   GI/Hepatic negative GI ROS, Neg liver ROS,   Endo/Other  negative endocrine ROSneg diabetes  Renal/GU negative Renal ROS     Musculoskeletal negative musculoskeletal ROS (+)   Abdominal (+) - obese,   Peds  Hematology negative hematology ROS (+)   Anesthesia Other Findings   Reproductive/Obstetrics                             Anesthesia Physical Anesthesia Plan  ASA: I  Anesthesia Plan: General   Post-op Pain Management:    Induction: Intravenous  PONV Risk Score and Plan: 2 and Ondansetron, Dexamethasone, Propofol and Midazolam  Airway Management Planned: Oral ETT  Additional Equipment:   Intra-op Plan:   Post-operative Plan: Extubation in OR  Informed Consent: I have reviewed the patients History and Physical, chart, labs and discussed the procedure including the risks, benefits and alternatives for the proposed anesthesia with the patient or authorized representative who has indicated his/her understanding and acceptance.   Dental advisory given  Plan Discussed with: CRNA and Anesthesiologist  Anesthesia Plan Comments:         Anesthesia Quick Evaluation

## 2016-07-06 NOTE — Progress Notes (Signed)
Patient returned from PACU

## 2016-07-06 NOTE — Op Note (Signed)
Laparoscopic Cholecystectomy  Pre-operative Diagnosis: Acute cholecystitis  Post-operative Diagnosis: Acute on chronic cholecystitis  Procedure: Laparoscopic cholecystectomy  Surgeon: Leonette Mostharles T. Tonita CongWoodham, MD FACS  Anesthesia: Gen. with endotracheal tube  Assistant: None  Procedure Details  The patient was seen again in the Holding Room. The benefits, complications, treatment options, and expected outcomes were discussed with the patient. The risks of bleeding, infection, recurrence of symptoms, failure to resolve symptoms, bile duct damage, bile duct leak, retained common bile duct stone, bowel injury, any of which could require further surgery and/or ERCP, stent, or papillotomy were reviewed with the patient. The likelihood of improving the patient's symptoms with return to their baseline status is good.  The patient and/or family concurred with the proposed plan, giving informed consent.  The patient was taken to Operating Room, identified as Christine Flowers and the procedure verified as Laparoscopic Cholecystectomy.  A Time Out was held and the above information confirmed.  Prior to the induction of general anesthesia, antibiotic prophylaxis was administered. VTE prophylaxis was in place. General endotracheal anesthesia was then administered and tolerated well. After the induction, the abdomen was prepped with Chloraprep and draped in the sterile fashion. The patient was positioned in the supine position.  Local anesthetic  was injected into the skin near the umbilicus and an incision made. The Veress needle was placed. Pneumoperitoneum was then created with CO2 and tolerated well without any adverse changes in the patient's vital signs. A 5mm port was placed in the periumbilical position and the abdominal cavity was explored.  Two 5-mm ports were placed in the right upper quadrant and a 12 mm epigastric port was placed all under direct vision. All skin incisions  were infiltrated with a  local anesthetic agent before making the incision and placing the trocars.   The patient was positioned  in reverse Trendelenburg, tilted slightly to the patient's left.  The gallbladder was identified, the fundus grasped and retracted cephalad. Adhesions were lysed bluntly and with electrocautery. The infundibulum was grasped and retracted laterally, exposing the peritoneum overlying the triangle of Calot. This was then divided and exposed in a blunt fashion. A critical view of the cystic duct and cystic artery was obtained.  The cystic duct was clearly identified and bluntly dissected.   The rind around the gallbladder is noted to be thick and the gallbladder was retracted into the liver. The majority of the dissection of the lateral rind was done with electrocautery. A small anterior cystic artery was identified and serially clipped and cut in between with Endo Shears. The duct was then serially clipped and cut with shears after the of safety. A posterior artery was also then identified going into the posterior wall the gallbladder that was also serially clipped and cut.  The gallbladder was taken from the gallbladder fossa in a retrograde fashion with the electrocautery. The gallbladder was removed and placed in an Endocatch bag. The liver bed was irrigated and inspected. Hemostasis was achieved with the electrocautery. Copious irrigation was utilized and was repeatedly aspirated until clear.  The gallbladder and Endocatch sac were then removed through the epigastric port site.   Due to the contracted nature of the gallbladder and the numerous stones within the epigastric port site had to be widened sharply and bluntly to allow for the gallbladder to be retrieved.  Inspection of the right upper quadrant was performed. No bleeding, bile duct injury or leak, or bowel injury was noted. Pneumoperitoneum was released.  The epigastric port site was  closed with figure-of-eight 0 Vicryl sutures. 4-0  subcuticular Monocryl was used to close the skin. Steristrips and Mastisol and sterile dressings were  applied.  The patient was then extubated and brought to the recovery room in stable condition. Sponge, lap, and needle counts were correct at closure and at the conclusion of the case.   Findings: Acute on chronic Cholecystitis   Estimated Blood Loss: 30 mL         Drains: None         Specimens: Gallbladder           Complications: none               Christine Flowers T. Tonita Cong, MD, FACS

## 2016-07-06 NOTE — Progress Notes (Signed)
Patient transported to OR.

## 2016-07-06 NOTE — Anesthesia Procedure Notes (Signed)
Procedure Name: Intubation Date/Time: 07/06/2016 12:55 PM Performed by: Doreen Salvage Pre-anesthesia Checklist: Patient identified, Emergency Drugs available, Suction available and Patient being monitored Patient Re-evaluated:Patient Re-evaluated prior to inductionOxygen Delivery Method: Circle system utilized Preoxygenation: Pre-oxygenation with 100% oxygen Intubation Type: IV induction Ventilation: Mask ventilation without difficulty Laryngoscope Size: Mac and 3 Grade View: Grade I Tube type: Oral Tube size: 7.0 mm Number of attempts: 1 Airway Equipment and Method: Stylet Placement Confirmation: ETT inserted through vocal cords under direct vision,  positive ETCO2 and breath sounds checked- equal and bilateral Secured at: 22 cm Tube secured with: Tape Dental Injury: Teeth and Oropharynx as per pre-operative assessment

## 2016-07-06 NOTE — Transfer of Care (Signed)
Immediate Anesthesia Transfer of Care Note  Patient: Christine Flowers  Procedure(s) Performed: Procedure(s): LAPAROSCOPIC CHOLECYSTECTOMY (N/A)  Patient Location: PACU  Anesthesia Type:General  Level of Consciousness: awake  Airway & Oxygen Therapy: Patient connected to face mask oxygen  Post-op Assessment: Post -op Vital signs reviewed and stable  Post vital signs: stable  Last Vitals:  Vitals:   07/06/16 1138 07/06/16 1411  BP: 116/77 132/82  Pulse: (!) 58 83  Resp: 14 (!) 6  Temp: 36.6 C 37.1 C    Last Pain:  Vitals:   07/06/16 1411  TempSrc: Temporal  PainSc:          Complications: No apparent anesthesia complications

## 2016-07-06 NOTE — Brief Op Note (Signed)
07/05/2016 - 07/06/2016  1:53 PM  PATIENT:  Christine Flowers  51 y.o. female  PRE-OPERATIVE DIAGNOSIS:  acute cholecystitis  POST-OPERATIVE DIAGNOSIS:  Chronic cholecystitis  PROCEDURE:  Procedure(s): LAPAROSCOPIC CHOLECYSTECTOMY (N/A)  SURGEON:  Surgeon(s) and Role:    * Ricarda FrameWoodham, Doshia Dalia, MD - Primary  PHYSICIAN ASSISTANT: None  ASSISTANTS: none   ANESTHESIA:   general  EBL:  Total I/O In: 267 [I.V.:232; IV Piggyback:35] Out: 30 [Blood:30]  BLOOD ADMINISTERED:none  DRAINS: none   LOCAL MEDICATIONS USED:  MARCAINE   , XYLOCAINE  and Amount: 20 ml  SPECIMEN:  Source of Specimen:  Gallbladder  DISPOSITION OF SPECIMEN:  PATHOLOGY  COUNTS:  YES  TOURNIQUET:  * No tourniquets in log *  DICTATION: .Dragon Dictation  PLAN OF CARE: Returned inpatient  PATIENT DISPOSITION:  PACU - hemodynamically stable.   Delay start of Pharmacological VTE agent (>24hrs) due to surgical blood loss or risk of bleeding: no

## 2016-07-06 NOTE — Progress Notes (Signed)
CC: Cholecystitis Subjective: Patient reports that her pain has almost completely resolved. She is ready be done with surgery.  Objective: Vital signs in last 24 hours: Temp:  [97.6 F (36.4 C)-98.1 F (36.7 C)] 97.6 F (36.4 C) (06/14 0417) Pulse Rate:  [55-75] 55 (06/14 0417) Resp:  [17-20] 17 (06/14 0417) BP: (105-121)/(64-78) 105/64 (06/14 0417) SpO2:  [97 %-99 %] 99 % (06/14 0417) Weight:  [73.6 kg (162 lb 4.8 oz)-75.3 kg (166 lb)] 73.6 kg (162 lb 4.8 oz) (06/13 1722) Last BM Date: 07/05/16  Intake/Output from previous day: 06/13 0701 - 06/14 0700 In: 1201 [P.O.:120; I.V.:1041; IV Piggyback:40] Out: 1350 [Urine:1350] Intake/Output this shift: Total I/O In: 267 [I.V.:232; IV Piggyback:35] Out: -   Physical exam:  Gen.: No acute distress Chest: Clear to sedation Heart: Regular rhythm Abdomen: Soft, nontender, nondistended  Lab Results: CBC   Recent Labs  07/05/16 1906 07/06/16 0502  WBC 7.2 7.6  HGB 14.0 13.2  HCT 40.9 38.8  PLT 160 148*   BMET  Recent Labs  07/05/16 1906 07/06/16 0502  NA 136 137  K 3.6 3.9  CL 105 107  CO2 23 26  GLUCOSE 180* 106*  BUN 10 9  CREATININE 0.63 0.57  CALCIUM 9.1 8.5*   PT/INR No results for input(s): LABPROT, INR in the last 72 hours. ABG No results for input(s): PHART, HCO3 in the last 72 hours.  Invalid input(s): PCO2, PO2  Studies/Results: No results found.  Anti-infectives: Anti-infectives    Start     Dose/Rate Route Frequency Ordered Stop   07/05/16 2200  piperacillin-tazobactam (ZOSYN) IVPB 3.375 g     3.375 g 12.5 mL/hr over 240 Minutes Intravenous Every 8 hours 07/05/16 1724        Assessment/Plan:  51 year old female admitted with chronic cholecystitis from clinic yesterday. Clinically resolved but she still desires to undergo surgery. I discussed the procedure in detail. We discussed the risks and benefits of a laparoscopic cholecystectomy and possible cholangiogram including, but not  limited to bleeding, infection, injury to surrounding structures such as the intestine or liver, bile leak, retained gallstones, need to convert to an open procedure, prolonged diarrhea, blood clots such as  DVT, common bile duct injury, anesthesia risks, and possible need for additional procedures.  The likelihood of improvement in symptoms and return to the patient's normal status is good. We discussed the typical post-operative recovery course. Plan for surgery later today.   Nupur Hohman T. Tonita CongWoodham, MD, [facility] General Surgeon [facility]  Day [facility] [phone](7a-7p) [phone] Night [facility] [phone](7p-7a) [phone] 07/06/2016

## 2016-07-06 NOTE — Anesthesia Post-op Follow-up Note (Cosign Needed)
Anesthesia QCDR form completed.        

## 2016-07-06 NOTE — Anesthesia Postprocedure Evaluation (Signed)
Anesthesia Post Note  Patient: Christine Flowers  Procedure(s) Performed: Procedure(s) (LRB): LAPAROSCOPIC CHOLECYSTECTOMY (N/A)  Patient location during evaluation: PACU Anesthesia Type: General Level of consciousness: awake and alert and oriented Pain management: pain level controlled Vital Signs Assessment: post-procedure vital signs reviewed and stable Respiratory status: spontaneous breathing, nonlabored ventilation and respiratory function stable Cardiovascular status: blood pressure returned to baseline and stable Postop Assessment: no signs of nausea or vomiting Anesthetic complications: no     Last Vitals:  Vitals:   07/06/16 1426 07/06/16 1445  BP: 133/86   Pulse: 81 73  Resp: 17 16  Temp:      Last Pain:  Vitals:   07/06/16 1445  TempSrc:   PainSc: 10-Worst pain ever                 Elyse Prevo

## 2016-07-07 ENCOUNTER — Encounter: Payer: Self-pay | Admitting: General Surgery

## 2016-07-07 LAB — SURGICAL PATHOLOGY

## 2016-07-07 LAB — HIV ANTIBODY (ROUTINE TESTING W REFLEX): HIV Screen 4th Generation wRfx: NONREACTIVE

## 2016-07-07 MED ORDER — HYDROCODONE-ACETAMINOPHEN 5-325 MG PO TABS: 1.0000 | ORAL_TABLET | ORAL | 0 refills | Status: AC | PRN

## 2016-07-07 NOTE — Care Management (Signed)
Patient to discharge today. Only new medication at discharge pain medication.  No RNCM needs identified

## 2016-07-07 NOTE — Progress Notes (Signed)
Patient complains of abdominal pain. PRN hydrocodone given per PRN order. Pt rates pain leve 7. Throbbing, tight-like pain. Will continue to monitor to end of shift.

## 2016-07-07 NOTE — Discharge Summary (Signed)
Patient ID: Christine Flowers MRN: [id number] DOB/AGE: 07/23/1965 51 y.o.  Admit date: 07/05/2016 Discharge date: 07/07/2016  Discharge Diagnoses:  Cholecystitis  Procedures Performed: Laparoscopic cholecystectomy  Discharged Condition: good  Hospital Course: Patient was admitted from the clinic setting with a diagnosis of acute cholecystitis. Underwent a laparoscopic cholecystectomy and tolerated the procedure well. Was able be discharged home the first postoperative day. On the day of discharge she was tolerating a diet and her abdomen was soft, nondistended, minimally tender to palpation at her incision sites.  Discharge Orders: Discharge Instructions    Call MD for:  persistant nausea and vomiting    Complete by:  As directed    Call MD for:  redness, tenderness, or signs of infection (pain, swelling, redness, odor or green/yellow discharge around incision site)    Complete by:  As directed    Call MD for:  severe uncontrolled pain    Complete by:  As directed    Call MD for:  temperature >100.4    Complete by:  As directed    Diet - low sodium heart healthy    Complete by:  As directed    Increase activity slowly    Complete by:  As directed       Disposition: 01-Home or Self Care  Discharge Medications: Allergies as of 07/07/2016   No Known Allergies     Medication List    STOP taking these medications   ondansetron 4 MG tablet Commonly known as:  ZOFRAN   oxyCODONE-acetaminophen 5-325 MG tablet Commonly known as:  ROXICET     TAKE these medications   HYDROcodone-acetaminophen 5-325 MG tablet Commonly known as:  NORCO/VICODIN Take 1-2 tablets by mouth every 4 (four) hours as needed for moderate pain or severe pain.   ibuprofen 200 MG tablet Commonly known as:  ADVIL,MOTRIN Take 200 mg by mouth every 6 (six) hours as needed.        Follwup: Follow-up Information    Jonette Pesaeyes, Lucia, MD Follow up on 07/18/2016.   Specialty:  Family Medicine Why:   11am.  PCP follow up Contact information: [facility] [address] [place] [address] [phone]        Ricarda FrameWoodham, Seana Underwood, MD. Go in 1 week(s).   Specialty:  General Surgery Why:  postop Contact information: [address] [place] [address] [phone]           Signed: Ricarda FrameCharles Gloris Shiroma 07/07/2016, 1:52 PM

## 2016-07-07 NOTE — Care Management (Signed)
Patient post Laparoscopic cholecystectomy.  [facility] [place].  Last seen May 31th.  Patient has follow up appointment scheduled Jun 26th.  Appointment added to AVS. Patient uses their pharmacy to obtain medication.  RNCM following should needs arise.

## 2016-07-07 NOTE — Discharge Instructions (Signed)
Laparoscopic Cholecystectomy, Care After °This sheet gives you information about how to care for yourself after your procedure. Your health care provider may also give you more specific instructions. If you have problems or questions, contact your health care provider. °What can I expect after the procedure? °After the procedure, it is common to have: °· Pain at your incision sites. You will be given medicines to control this pain. °· Mild nausea or vomiting. °· Bloating and possible shoulder pain from the air-like gas that was used during the procedure. °Follow these instructions at home: °Incision care  ° °· Follow instructions from your health care provider about how to take care of your incisions. Make sure you: °¨ Wash your hands with soap and water before you change your bandage (dressing). If soap and water are not available, use hand sanitizer. °¨ Change your dressing as told by your health care provider. °¨ Leave stitches (sutures), skin glue, or adhesive strips in place. These skin closures may need to be in place for 2 weeks or longer. If adhesive strip edges start to loosen and curl up, you may trim the loose edges. Do not remove adhesive strips completely unless your health care provider tells you to do that. °· Do not take baths, swim, or use a hot tub until your health care provider approves. Ask your health care provider if you can take showers. You may only be allowed to take sponge baths for bathing. °· Check your incision area every day for signs of infection. Check for: °¨ More redness, swelling, or pain. °¨ More fluid or blood. °¨ Warmth. °¨ Pus or a bad smell. °Activity  °· Do not drive or use heavy machinery while taking prescription pain medicine. °· Do not lift anything that is heavier than 10 lb (4.5 kg) until your health care provider approves. °· Do not play contact sports until your health care provider approves. °· Do not drive for 24 hours if you were given a medicine to help you relax  (sedative). °· Rest as needed. Do not return to work or school until your health care provider approves. °General instructions  °· Take over-the-counter and prescription medicines only as told by your health care provider. °· To prevent or treat constipation while you are taking prescription pain medicine, your health care provider may recommend that you: °¨ Drink enough fluid to keep your urine clear or pale yellow. °¨ Take over-the-counter or prescription medicines. °¨ Eat foods that are high in fiber, such as fresh fruits and vegetables, whole grains, and beans. °¨ Limit foods that are high in fat and processed sugars, such as fried and sweet foods. °Contact a health care provider if: °· You develop a rash. °· You have more redness, swelling, or pain around your incisions. °· You have more fluid or blood coming from your incisions. °· Your incisions feel warm to the touch. °· You have pus or a bad smell coming from your incisions. °· You have a fever. °· One or more of your incisions breaks open. °Get help right away if: °· You have trouble breathing. °· You have chest pain. °· You have increasing pain in your shoulders. °· You faint or feel dizzy when you stand. °· You have severe pain in your abdomen. °· You have nausea or vomiting that lasts for more than one day. °· You have leg pain. °This information is not intended to replace advice given to you by your health care provider. Make sure you discuss any   questions you have with your health care provider. °Document Released: 01/09/2005 Document Revised: 07/31/2015 Document Reviewed: 06/28/2015 °Elsevier Interactive Patient Education © 2017 [employer] ° °

## 2016-07-10 ENCOUNTER — Encounter: Payer: Self-pay | Admitting: General Surgery

## 2016-07-10 ENCOUNTER — Telehealth: Payer: Self-pay

## 2016-07-10 NOTE — Telephone Encounter (Signed)
Called patient to ask her how she was feeling after surgery.  1. Patient stated that she has been feeling good after her surgery. No more abdominal pain.  2. Patient stated that her pain is under control by taking the pain medication that was prescribed. She takes 1 tablet of Hydrocodone-Acetaminophen 5-325 MG in the AM and one in the PM. This tends to take care of the pain.  3. Taking her Pain medication.  4. Patient stated that she had not had any vomiting but had been nauseas.  I recommended for her to eat saltines and drink some Ginger Ale. If this did not help her, I told her to give me a call. Patient understood.  5. Patient stated that she was not having any fever or chills.  6. Patient stated that she had been constipated for the first two days after surgery but now she is able to have a good bowel movement.   7. Patient stated that she has not had any swelling or bruising at this time.  8. Patient stated that she did not have any questions or concerns at this time. However, I told patient to give me a call in case she had further questions. Patient understood.  Patient was reminded of her appointment for 07/13/2016 with Dr. Tonita CongWoodham. Patient understood.

## 2016-07-10 NOTE — Telephone Encounter (Signed)
Had Kandis CockingMaritza to call patient due to language barrier.    Post-op call made to patient at this time. Spoke with . Post-op interview questions below.  1. How are you feeling?   2. Is your pain controlled?  3. What are you doing for the pain?   4. Are you having any Nausea or Vomiting?   5. Are you having any Fever or Chills?   6. Are you having any Constipation or Diarrhea?   7. Is there any Swelling or Bruising you are concerned about?  8. Do you have any questions or concerns at this time?    Discussion:

## 2016-07-13 ENCOUNTER — Ambulatory Visit (INDEPENDENT_AMBULATORY_CARE_PROVIDER_SITE_OTHER): Payer: Self-pay | Admitting: General Surgery

## 2016-07-13 ENCOUNTER — Encounter: Payer: Self-pay | Admitting: General Surgery

## 2016-07-13 VITALS — BP 120/82 | HR 76 | Temp 97.5°F | Ht 64.0 in | Wt 165.8 lb

## 2016-07-13 DIAGNOSIS — Z4889 Encounter for other specified surgical aftercare: Secondary | ICD-10-CM

## 2016-07-13 NOTE — Progress Notes (Signed)
Outpatient Surgical Follow Up  07/13/2016  Christine BeaversJuana Cambero Mcarthur RossettiZuniga is an 51 y.o. female.   Chief Complaint  Patient presents with  . Routine Post Op    Laparoscopic Cholecystectomy-07/06/16-Dr.Spurgeon Gancarz    HPI: 51 year old female returns to clinic 1 week status post laparoscopic cholecystectomy. Patient reports doing well. She had 1 episode of nausea since discharge that was secondary to taking her pain medicine. Otherwise she states she is eating well and denies any fevers, chills, chest pain, shortness of breath, diarrhea, constipation. She's been very happy with her surgical treatment.  Past Medical History:  Diagnosis Date  . Acute cholecystitis 07/05/2016  . Cholecystitis 07/05/2016  . Gallstones     Past Surgical History:  Procedure Laterality Date  . CHOLECYSTECTOMY N/A 07/06/2016   Procedure: LAPAROSCOPIC CHOLECYSTECTOMY;  Surgeon: Christine FrameWoodham, Shawne Bulow, MD;  Location: [facility] ORS;  Service: General;  Laterality: N/A;  . NO PAST SURGERIES      Family History  Problem Relation Age of Onset  . Cervical cancer Mother   . Gallstones Mother   . Diabetes Mother   . Hypotension Mother   . Hypertension Father   . Diabetes Father   . Emphysema Father   . Diabetes Paternal Aunt   . Breast cancer Other   . Cervical cancer Maternal Aunt     Social History:  reports that she has never smoked. She has never used smokeless tobacco. She reports that she does not drink alcohol or use drugs.  Allergies: No Known Allergies  Medications reviewed.    ROS A multipoint review of systems was completed, all pertinent positives and negatives are documented within the history of present illness the remainder are negative   BP 120/82   Pulse 76   Temp 97.5 F (36.4 C) (Oral)   Ht 5\' 4"  (1.626 m)   Wt 75.2 kg (165 lb 12.8 oz)   BMI 28.46 kg/m   Physical Exam Gen.: No acute distress Chest: Clear to auscultation Heart: Regular rhythm Abdomen: Soft, nontender, nondistended. Well  approximately lap scopic incisions with Steri-Strips still in place. Resolving ecchymosis to the upper midline incision site no evidence of erythema or drainage.    No results found for this or any previous visit (from the past 48 hour(s)). No results found.  Assessment/Plan:  1. Aftercare following surgery 51 year old female status post laparoscopic cholecystectomy. Pathology reviewed with patient. Discussed anticipated return to normal activities and standard postoperative precautions. Patient and her daughter voiced understanding and will follow-up in clinic on an as-needed basis.     Christine Frameharles Cris Gibby, MD FACS General Surgeon  07/13/2016,3:06 PM

## 2016-07-13 NOTE — Patient Instructions (Signed)
  Please call our office with any questions or concerns.  Please do not submerge in a tub, hot tub, or pool until incisions are completely sealed.  Use sun block to incision area over the next year if this area will be exposed to sun. This helps decrease scarring.  You may resume your normal activities on 08/17/16.  At that time- Listen to your body when lifting, if you have pain when lifting, stop and then try again in a few days. Pain after doing exercises or activities of daily living is normal as you get back in to your normal routine.  If you develop redness, drainage, or pain at incision sites- call our office immediately and speak with a nurse.

## 2016-08-28 ENCOUNTER — Ambulatory Visit: Payer: Self-pay

## 2016-09-20 ENCOUNTER — Ambulatory Visit
Admission: RE | Admit: 2016-09-20 | Discharge: 2016-09-20 | Disposition: A | Discharge status: Home / Self Care | Payer: Self-pay | Source: Ambulatory Visit | Attending: Oncology | Admitting: Oncology

## 2016-09-20 ENCOUNTER — Ambulatory Visit: Payer: Self-pay | Attending: Oncology | Admitting: *Deleted

## 2016-09-20 ENCOUNTER — Encounter: Payer: Self-pay | Admitting: *Deleted

## 2016-09-20 VITALS — BP 121/78 | HR 72 | Temp 96.0°F | Ht 66.0 in | Wt 164.0 lb

## 2016-09-20 DIAGNOSIS — Z0000 Encounter for general adult medical examination without abnormal findings: Secondary | ICD-10-CM

## 2016-09-20 NOTE — Progress Notes (Signed)
Subjective:     Patient ID: Christine Flowers, female   DOB: 09/10/1965, 51 y.o.   MRN: [id number]  HPI   Review of Systems     Objective:   Physical Exam  Pulmonary/Chest: Right breast exhibits no inverted nipple, no mass, no nipple discharge, no skin change and no tenderness. Left breast exhibits no inverted nipple, no mass, no nipple discharge, no skin change and no tenderness. Breasts are symmetrical.         Assessment:     51 [facility] Hispanic [facility]  Lloyda, the interpreter present during the interview and exam.  Clinical breast exam with scattered thick mobile glandular like tissue bilateral.  No dominant mass, skin changes, lymphadenopathy, or nipple discharge.  Taught self breast awareness.  Last pap was on 07/18/16 was negative with no HPV co-testing.  Next pap due in 2021. Patient has been screened for eligibility.  She does not have any insurance, Medicare or Medicaid.  She also meets financial eligibility.  Hand-out given on the Affordable Care Act.    Plan:     Screening mammogram ordered.  Will follow-up per [facility] protocol.

## 2016-09-20 NOTE — Patient Instructions (Signed)
Gave patient hand-out, Women Staying Healthy, Active and Well from [facility], with education on breast health, pap smears, heart and colon health. 

## 2016-09-27 ENCOUNTER — Other Ambulatory Visit: Payer: Self-pay | Admitting: *Deleted

## 2016-09-27 ENCOUNTER — Inpatient Hospital Stay
Admission: RE | Admit: 2016-09-27 | Discharge: 2016-09-27 | Disposition: A | Discharge status: Home / Self Care | Payer: Self-pay | Source: Ambulatory Visit | Attending: *Deleted | Admitting: *Deleted

## 2016-09-27 DIAGNOSIS — Z9289 Personal history of other medical treatment: Secondary | ICD-10-CM

## 2016-09-29 ENCOUNTER — Encounter: Payer: Self-pay | Admitting: *Deleted

## 2016-09-29 NOTE — Progress Notes (Signed)
Letter mailed from the [facility] to inform patient of her normal mammogram results.  Patient is to follow-up with annual screening in one year.  HSIS to Christy. 

## 2019-03-19 IMAGING — CR DG CHEST 2V
2 series · 2 of 2 positions shown · non-contrast
Comparison: 03/30/2014

CLINICAL DATA: Chest pain back pain

EXAM:
CHEST  2 VIEW

[chest pa]
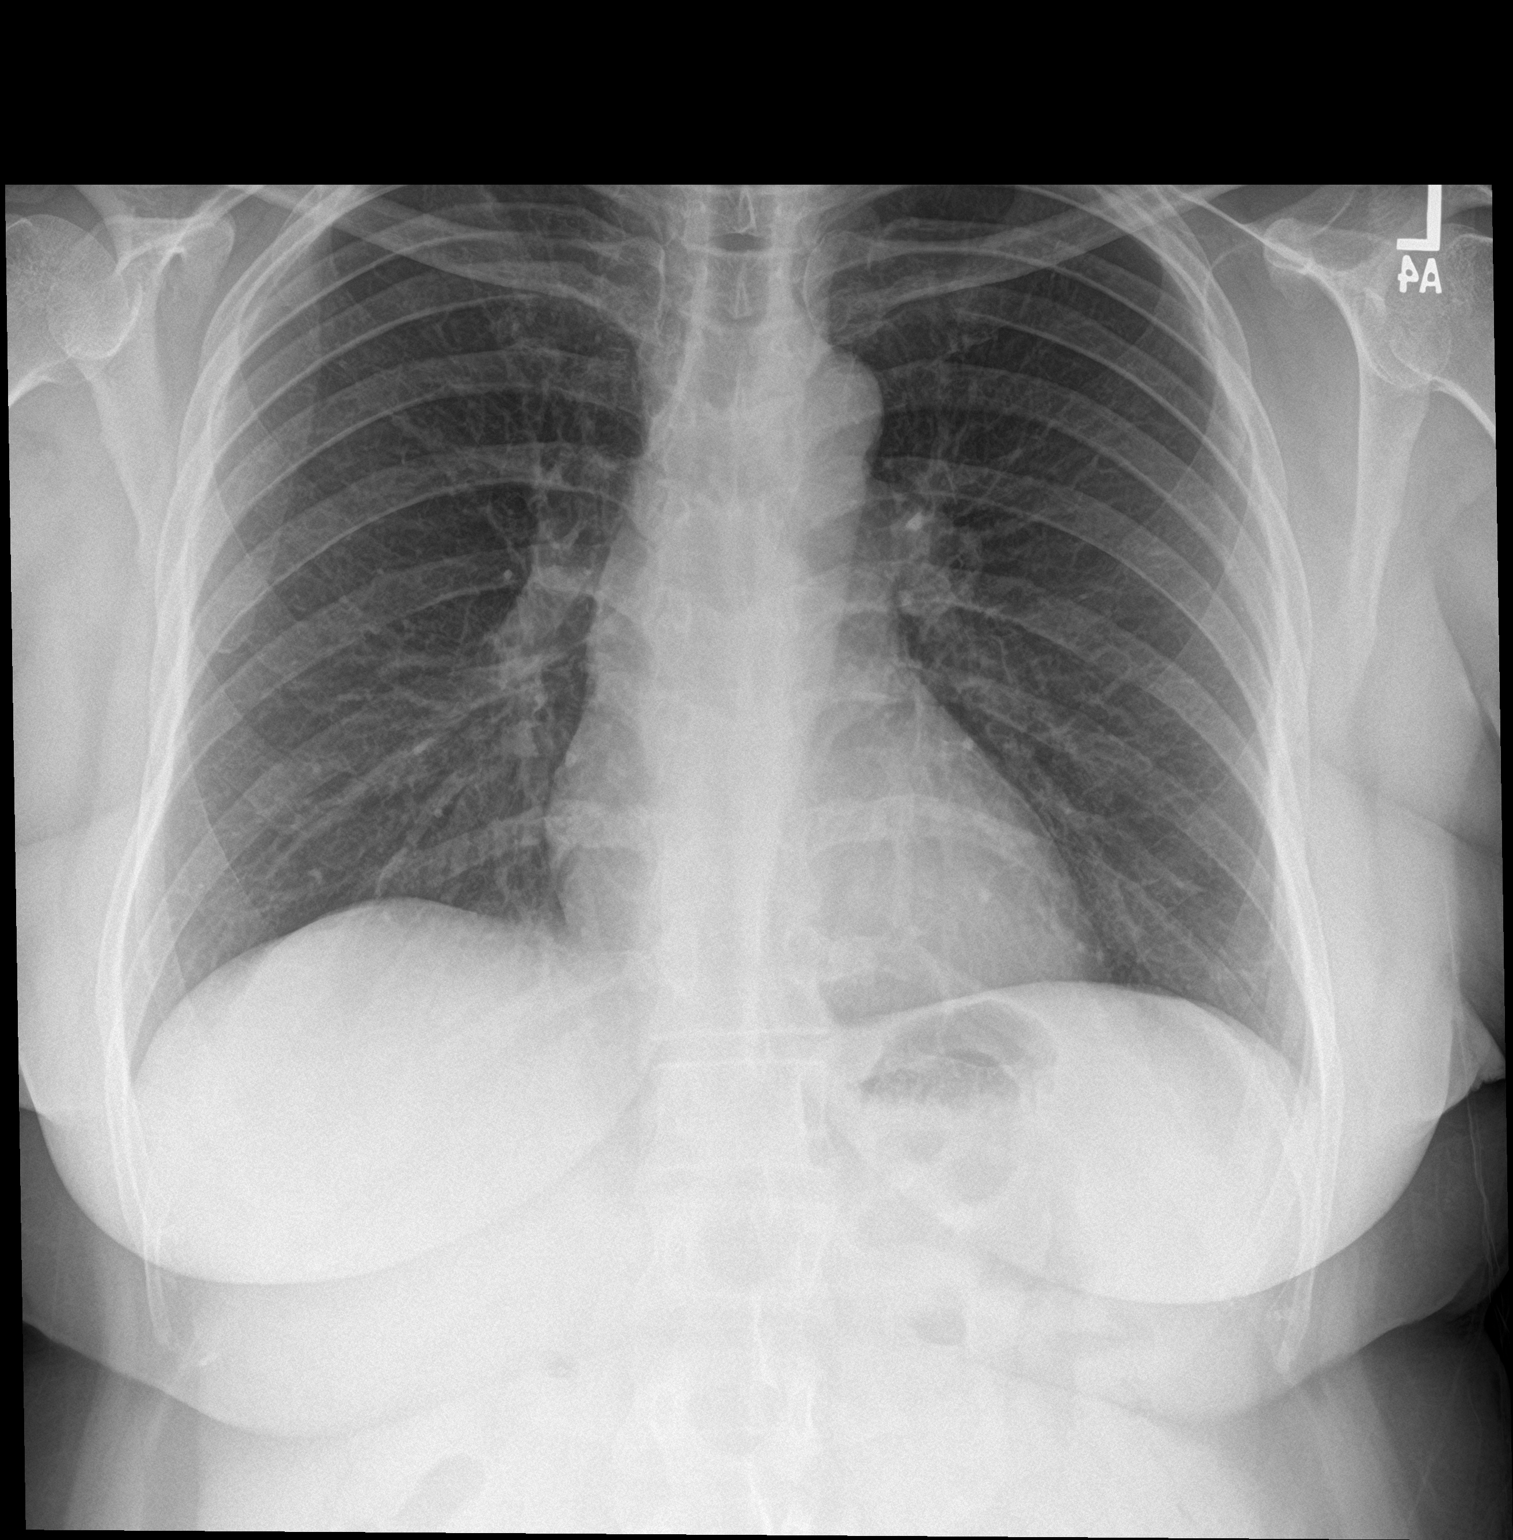

[chest lat]
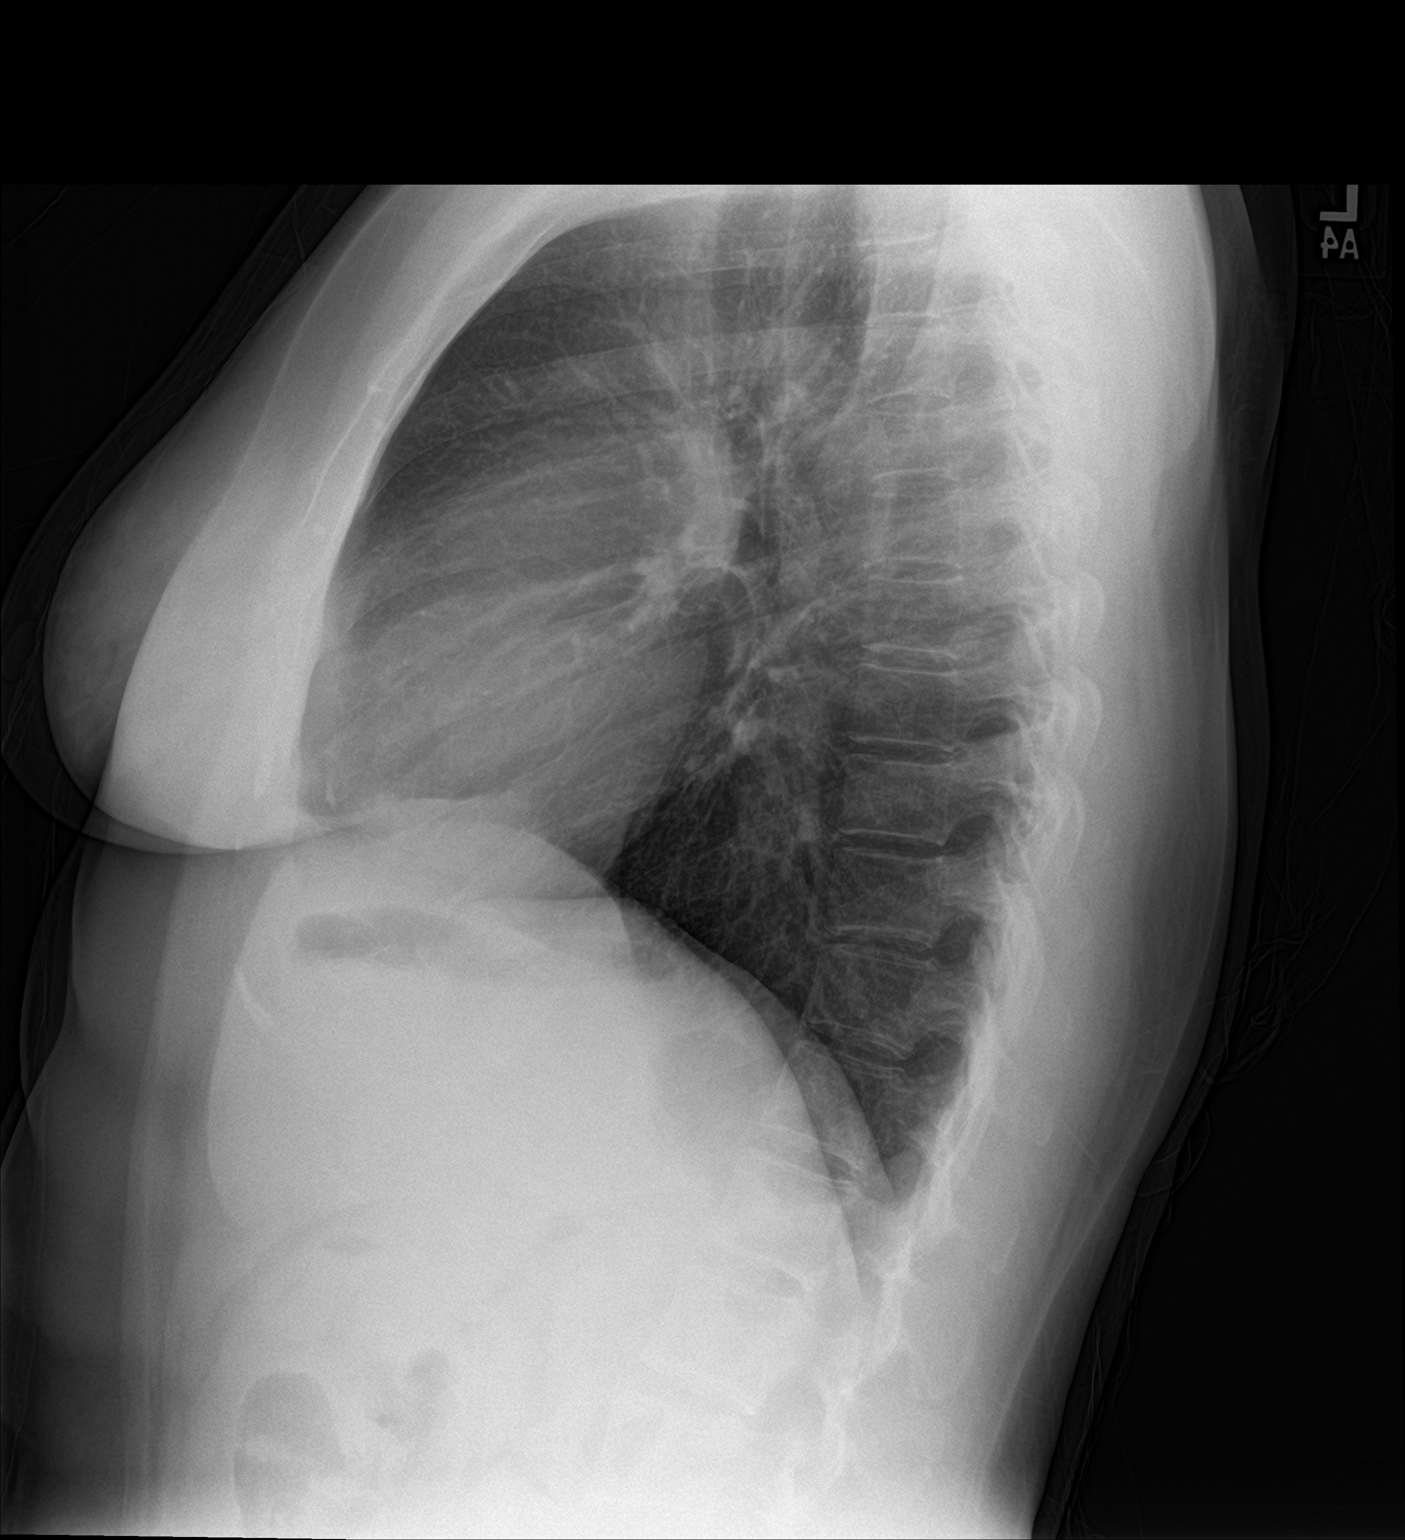

[2 of 2 positions shown; findings below may reference images not displayed]

FINDINGS: The heart size and mediastinal contours are within normal limits.
Both lungs are clear. The visualized skeletal structures are
unremarkable.
IMPRESSION: No active cardiopulmonary disease.

## 2019-08-02 ENCOUNTER — Ambulatory Visit: Payer: MEDICAID | Attending: Internal Medicine

## 2019-08-02 DIAGNOSIS — Z23 Encounter for immunization: Secondary | ICD-10-CM

## 2019-08-02 NOTE — Progress Notes (Signed)
   Covid-19 Vaccination Clinic  Name:  Christine Flowers    MRN: [id number] DOB: 09/28/1965  08/02/2019  Ms. Christine Flowers was observed post Covid-19 immunization for 15 minutes without incident. She was provided with Vaccine Information Sheet and instruction to access the V-Safe system.   Ms. Christine Flowers was instructed to call 911 with any severe reactions post vaccine: Marland Kitchen Difficulty breathing  . Swelling of face and throat  . A fast heartbeat  . A bad rash all over body  . Dizziness and weakness   Immunizations Administered    Name Date Dose VIS Date Route   Pfizer COVID-19 Vaccine 08/02/2019 12:13 PM 0.3 mL 03/19/2018 Intramuscular   Manufacturer: [employer], [employer]   Lot: [id number]   NDC: [address]-3

## 2019-08-27 ENCOUNTER — Ambulatory Visit
Admission: RE | Admit: 2019-08-27 | Discharge: 2019-08-27 | Disposition: A | Discharge status: Home / Self Care | Payer: MEDICAID | Source: Ambulatory Visit | Attending: Oncology | Admitting: Oncology

## 2019-08-27 ENCOUNTER — Other Ambulatory Visit: Payer: Self-pay

## 2019-08-27 ENCOUNTER — Ambulatory Visit: Payer: MEDICAID | Attending: Oncology | Admitting: *Deleted

## 2019-08-27 ENCOUNTER — Encounter (INDEPENDENT_AMBULATORY_CARE_PROVIDER_SITE_OTHER): Payer: Self-pay

## 2019-08-27 ENCOUNTER — Encounter: Payer: Self-pay | Admitting: *Deleted

## 2019-08-27 VITALS — BP 134/100 | HR 89 | Temp 98.4°F | Ht 64.25 in | Wt 178.9 lb

## 2019-08-27 DIAGNOSIS — Z0000 Encounter for general adult medical examination without abnormal findings: Secondary | ICD-10-CM

## 2019-08-27 NOTE — Patient Instructions (Signed)
Gave patient hand-out, Women Staying Healthy, Active and Well from [facility], with education on breast health, pap smears, heart and colon health. 
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
## 2019-08-29 LAB — IGP, APTIMA HPV: HPV Aptima: NEGATIVE

## 2019-09-02 ENCOUNTER — Encounter: Payer: Self-pay | Admitting: *Deleted

## 2019-09-02 NOTE — Progress Notes (Signed)
Letter mailed to inform patient of her normal mammogram and pap smear results.  Next mammogram due in 1 year and pap smear in 5 years.

## 2019-11-15 ENCOUNTER — Emergency Department: Payer: Self-pay

## 2019-11-15 ENCOUNTER — Other Ambulatory Visit: Payer: Self-pay

## 2019-11-15 ENCOUNTER — Emergency Department
Admission: EM | Admit: 2019-11-15 | Discharge: 2019-11-15 | Disposition: A | Discharge status: Home / Self Care | Payer: Self-pay | Attending: Emergency Medicine | Admitting: Emergency Medicine

## 2019-11-15 DIAGNOSIS — R000 Tachycardia, unspecified: Secondary | ICD-10-CM | POA: Insufficient documentation

## 2019-11-15 DIAGNOSIS — T782XXA Anaphylactic shock, unspecified, initial encounter: Secondary | ICD-10-CM | POA: Insufficient documentation

## 2019-11-15 DIAGNOSIS — R0602 Shortness of breath: Secondary | ICD-10-CM | POA: Insufficient documentation

## 2019-11-15 DIAGNOSIS — R059 Cough, unspecified: Secondary | ICD-10-CM | POA: Insufficient documentation

## 2019-11-15 MED ORDER — METHYLPREDNISOLONE SODIUM SUCC 125 MG IJ SOLR
125.0000 mg | Freq: Once | INTRAMUSCULAR | Status: AC
  Administered 2019-11-15: 125 mg via INTRAVENOUS
  Filled 2019-11-15: qty 2

## 2019-11-15 MED ORDER — EPINEPHRINE 0.3 MG/0.3ML IJ SOAJ
INTRAMUSCULAR | Status: AC
Start: 2019-11-15 — End: 2019-11-15
  Administered 2019-11-15: 0.3 mg
  Filled 2019-11-15: qty 0.3

## 2019-11-15 MED ORDER — EPINEPHRINE 0.3 MG/0.3ML IJ SOAJ: 0.3000 mg | Freq: Once | INTRAMUSCULAR | Status: AC

## 2019-11-15 MED ORDER — FAMOTIDINE IN NACL 20-0.9 MG/50ML-% IV SOLN
20.0000 mg | Freq: Once | INTRAVENOUS | Status: AC
  Administered 2019-11-15: 20 mg via INTRAVENOUS
  Filled 2019-11-15: qty 50

## 2019-11-15 MED ORDER — PREDNISONE 20 MG PO TABS: 40.0000 mg | ORAL_TABLET | Freq: Every day | ORAL | 0 refills | Status: AC

## 2019-11-15 MED ORDER — EPINEPHRINE 0.3 MG/0.3ML IJ SOAJ: 0.3000 mg | Freq: Once | INTRAMUSCULAR | 2 refills | Status: AC

## 2019-11-15 MED ORDER — DIPHENHYDRAMINE HCL 25 MG PO TABS: 25.0000 mg | ORAL_TABLET | Freq: Four times a day (QID) | ORAL | 0 refills | Status: AC

## 2019-11-15 MED ORDER — DIPHENHYDRAMINE HCL 50 MG/ML IJ SOLN
50.0000 mg | Freq: Once | INTRAMUSCULAR | Status: AC
  Administered 2019-11-15: 50 mg via INTRAVENOUS
  Filled 2019-11-15: qty 1

## 2019-11-15 NOTE — ED Notes (Signed)
Pt resting comfortably, respirations even and unlabored. NAD noted, no complaints.

## 2019-11-15 NOTE — ED Provider Notes (Signed)
[facility] Emergency Department Provider Note  ____________________________________________  Time seen: Approximately 5:53 PM  I have reviewed the triage vital signs and the nursing notes.   HISTORY  Chief Complaint Allergic Reaction    HPI Christine Flowers is a 54 y.o. female with no significant past medical history who was in her usual state of health, ate a meal with strength and, and about 15 minutes later started having feeling of throat swelling, frequent cough, rash on the chest and upper arms.  Symptoms are constant, gradually worsening, denies shortness of breath.  No dizziness.  No aggravating or alleviating factors.  No known allergies.  Not taking any medications currently.   Past Medical History:  Diagnosis Date  . Acute cholecystitis 07/05/2016  . Cholecystitis 07/05/2016  . Gallstones      Patient Active Problem List   Diagnosis Date Noted  . Acute cholecystitis 07/05/2016  . Cholecystitis 07/05/2016     Past Surgical History:  Procedure Laterality Date  . CHOLECYSTECTOMY N/A 07/06/2016   Procedure: LAPAROSCOPIC CHOLECYSTECTOMY;  Surgeon: Ricarda Frame, MD;  Location: [facility] ORS;  Service: General;  Laterality: N/A;  . NO PAST SURGERIES       Prior to Admission medications   Medication Sig Start Date End Date Taking? Authorizing Provider  diphenhydrAMINE (BENADRYL) 25 MG tablet Take 1 tablet (25 mg total) by mouth every 6 (six) hours for 3 days. 11/15/19 11/18/19  Sharman Cheek, MD  EPINEPHrine 0.3 mg/0.3 mL IJ SOAJ injection Inject 0.3 mg into the muscle once for 1 dose. Follow package instructions as needed for severe allergy or anaphylactic reaction. 11/15/19 11/15/19  Sharman Cheek, MD  HYDROcodone-acetaminophen (NORCO/VICODIN) 5-325 MG tablet Take 1-2 tablets by mouth every 4 (four) hours as needed for moderate pain or severe pain. 07/07/16   Ricarda Frame, MD  ibuprofen (ADVIL,MOTRIN) 200 MG tablet Take 200 mg  by mouth every 6 (six) hours as needed.    [provider]  predniSONE (DELTASONE) 20 MG tablet Take 2 tablets (40 mg total) by mouth daily. 11/15/19   Sharman Cheek, MD     Allergies Patient has no known allergies.   Family History  Problem Relation Age of Onset  . Cervical cancer Mother   . Gallstones Mother   . Diabetes Mother   . Hypotension Mother   . Hypertension Father   . Diabetes Father   . Emphysema Father   . Diabetes Paternal Aunt   . Breast cancer Other   . Cervical cancer Maternal Aunt   . Breast cancer Cousin        paternal  . Breast cancer Sister 53    Social History Social History   Tobacco Use  . Smoking status: Never Smoker  . Smokeless tobacco: Never Used  Substance Use Topics  . Alcohol use: No  . Drug use: No    Review of Systems  Constitutional:   No fever or chills.  ENT:   Positive sensation of throat swelling Cardiovascular:   No chest pain or syncope. Respiratory:   No dyspnea positive cough. Gastrointestinal:   Negative for abdominal pain, vomiting and diarrhea.  Musculoskeletal:   Negative for focal pain or swelling All other systems reviewed and are negative except as documented above in ROS and HPI.  ____________________________________________   PHYSICAL EXAM:  VITAL SIGNS: ED Triage Vitals  Enc Vitals Group     BP 11/15/19 1633 124/87     Pulse Rate 11/15/19 1633 (!) 103  Resp 11/15/19 1633 20     Temp 11/15/19 1633 98.2 F (36.8 C)     Temp Source 11/15/19 1633 Oral     SpO2 11/15/19 1633 95 %     Weight 11/15/19 1639 170 lb (77.1 kg)     Height 11/15/19 1639 5\' 3"  (1.6 m)     Head Circumference --      Peak Flow --      Pain Score 11/15/19 1638 8     Pain Loc --      Pain Edu? --      Excl. in GC? --     Vital signs reviewed, nursing assessments reviewed.   Constitutional:   Alert and oriented. Non-toxic appearance. Eyes:   Conjunctivae are normal. EOMI. PERRL. ENT      Head:    Normocephalic and atraumatic.      Nose: Normal.      Mouth/Throat: No tongue elevation or floor mouth edema.  Oropharynx normal in appearance.      Neck:   No meningismus. Full ROM. Hematological/Lymphatic/Immunilogical:   No cervical lymphadenopathy. Cardiovascular:   Tachycardia heart rate 105. Symmetric bilateral radial and DP pulses.  No murmurs. Cap refill less than 2 seconds. Respiratory:   Normal respiratory effort without tachypnea/retractions. Breath sounds are clear and equal bilaterally. No wheezes/rales/rhonchi.  Frequent coughing. Gastrointestinal:   Soft and nontender. Non distended. There is no CVA tenderness.  No rebound, rigidity, or guarding.  Musculoskeletal:   Normal range of motion in all extremities. No joint effusions.  No lower extremity tenderness.  No edema. Neurologic:   Normal speech and language.  Motor grossly intact. No acute focal neurologic deficits are appreciated.  Skin:    Skin is warm, dry and intact.  There is diffuse erythematous rash over the upper chest and bilateral upper arms.  No petechiae, purpura, or bullae.  ____________________________________________    LABS (pertinent positives/negatives) (all labs ordered are listed, but only abnormal results are displayed) Labs Reviewed - No data to display ____________________________________________   EKG    ____________________________________________    RADIOLOGY  DG Chest Portable 1 View  Result Date: 11/15/2019 CLINICAL DATA:  Cough and shortness of breath. Possible allergic reaction. EXAM: PORTABLE CHEST 1 VIEW COMPARISON:  06/15/2016 chest radiograph FINDINGS: The cardiomediastinal silhouette is unremarkable. There is no evidence of focal airspace disease, pulmonary edema, suspicious pulmonary nodule/mass, pleural effusion, or pneumothorax. No acute bony abnormalities are identified. IMPRESSION: No active disease. Electronically Signed   By: 06/17/2016 M.D.   On: 11/15/2019 17:33     ____________________________________________   PROCEDURES Procedures  ____________________________________________    CLINICAL IMPRESSION / ASSESSMENT AND PLAN / ED COURSE  Medications ordered in the ED: Medications  EPINEPHrine (EPI-PEN) injection 0.3 mg (0.3 mg Intramuscular Given 11/15/19 1645)  methylPREDNISolone sodium succinate (SOLU-MEDROL) 125 mg/2 mL injection 125 mg (125 mg Intravenous Given 11/15/19 1659)  diphenhydrAMINE (BENADRYL) injection 50 mg (50 mg Intravenous Given 11/15/19 1658)  famotidine (PEPCID) IVPB 20 mg premix (0 mg Intravenous Stopped 11/15/19 1749)    Pertinent labs & imaging results that were available during my care of the patient were reviewed by me and considered in my medical decision making (see chart for details).  Christine Flowers was evaluated in Emergency Department on 11/15/2019 for the symptoms described in the history of present illness. She was evaluated in the context of the global COVID-19 pandemic, which necessitated consideration that the patient might be at risk for infection with the SARS-CoV-2 virus  that causes COVID-19. Institutional protocols and algorithms that pertain to the evaluation of patients at risk for COVID-19 are in a state of rapid change based on information released by regulatory bodies including the CDC and federal and state organizations. These policies and algorithms were followed during the patient's care in the ED.     Clinical Course as of Nov 14 1898  Sat Nov 15, 2019  1652 Patient presents with shortness of breath, diffuse erythematous rash, frequent coughing after strep exposure.  No known allergies, but rash and symptoms are suggestive of bronchoconstriction and anaphylaxis.  Will give IV Benadryl Pepcid Solu-Medrol and EpiPen injection and monitor symptoms in ED.  Oxygenation and blood pressure are normal.   [PS]  1841 Chest x-ray unremarkable.   [PS]    Clinical Course User Index [PS] Sharman Cheek, MD     ----------------------------------------- 6:59 PM on 11/15/2019 -----------------------------------------  Patient feels better.  Lungs clear auscultation bilaterally.  Rash is resolved.  No throat swelling or cough, she feels back to normal.  We will continue her on Benadryl and prednisone for the next 3 days, prescription for EpiPen to use in case of severe allergy symptoms.  Patient denies any questions at this time.  ____________________________________________   FINAL CLINICAL IMPRESSION(S) / ED DIAGNOSES    Final diagnoses:  Anaphylaxis, initial encounter     ED Discharge Orders         Ordered    predniSONE (DELTASONE) 20 MG tablet  Daily        11/15/19 1752    diphenhydrAMINE (BENADRYL) 25 MG tablet  Every 6 hours        11/15/19 1752    EPINEPHrine 0.3 mg/0.3 mL IJ SOAJ injection   Once        11/15/19 1752          Portions of this note were generated with dragon dictation software. Dictation errors may occur despite best attempts at proofreading.   Sharman Cheek, MD 11/15/19 1900

## 2019-11-15 NOTE — ED Triage Notes (Signed)
Pt to the er for severe allergic reaction. Pt has redness to the face, chest and over both arms. Pt reports itching. Pt reports eating shrimp just prior to episode. No prior allergy to shellfish.

## 2019-11-15 NOTE — ED Notes (Signed)
EDMD at bedside
